# Patient Record
Sex: Female | Born: 1990 | Hispanic: Yes | Marital: Married | Smoking: Never smoker
Health system: Southern US, Community
[De-identification: ages and names within clinical notes are randomized; demographics above are authoritative.]

## PROBLEM LIST (undated history)

## (undated) ENCOUNTER — Inpatient Hospital Stay (HOSPITAL_COMMUNITY): Payer: Self-pay

## (undated) DIAGNOSIS — N200 Calculus of kidney: Secondary | ICD-10-CM

## (undated) DIAGNOSIS — N390 Urinary tract infection, site not specified: Secondary | ICD-10-CM

## (undated) DIAGNOSIS — N159 Renal tubulo-interstitial disease, unspecified: Secondary | ICD-10-CM

## (undated) DIAGNOSIS — N739 Female pelvic inflammatory disease, unspecified: Secondary | ICD-10-CM

---

## 2014-04-25 ENCOUNTER — Encounter (HOSPITAL_COMMUNITY): Payer: Self-pay | Admitting: Emergency Medicine

## 2014-04-25 ENCOUNTER — Emergency Department (HOSPITAL_COMMUNITY)
Admission: EM | Admit: 2014-04-25 | Discharge: 2014-04-26 | Disposition: A | Discharge status: Home / Self Care | Payer: Medicaid Other | Attending: Emergency Medicine | Admitting: Emergency Medicine

## 2014-04-25 DIAGNOSIS — N739 Female pelvic inflammatory disease, unspecified: Secondary | ICD-10-CM | POA: Insufficient documentation

## 2014-04-25 DIAGNOSIS — N390 Urinary tract infection, site not specified: Secondary | ICD-10-CM | POA: Insufficient documentation

## 2014-04-25 DIAGNOSIS — Z87442 Personal history of urinary calculi: Secondary | ICD-10-CM | POA: Diagnosis not present

## 2014-04-25 DIAGNOSIS — N730 Acute parametritis and pelvic cellulitis: Secondary | ICD-10-CM

## 2014-04-25 DIAGNOSIS — R300 Dysuria: Secondary | ICD-10-CM | POA: Diagnosis present

## 2014-04-25 DIAGNOSIS — R109 Unspecified abdominal pain: Secondary | ICD-10-CM

## 2014-04-25 HISTORY — DX: Calculus of kidney: N20.0

## 2014-04-25 HISTORY — DX: Urinary tract infection, site not specified: N39.0

## 2014-04-25 LAB — BASIC METABOLIC PANEL
ANION GAP: 12 (ref 5–15)
BUN: 12 mg/dL (ref 6–23)
CO2: 22 mEq/L (ref 19–32)
Calcium: 9.3 mg/dL (ref 8.4–10.5)
Chloride: 102 mEq/L (ref 96–112)
Creatinine, Ser: 0.59 mg/dL (ref 0.50–1.10)
GFR calc Af Amer: >90 mL/min (ref >90)
Glucose, Bld: 90 mg/dL (ref 70–99)
POTASSIUM: 3.9 meq/L (ref 3.7–5.3)
SODIUM: 136 meq/L — AB (ref 137–147)

## 2014-04-25 LAB — CBC
HCT: 36.9 % (ref 36.0–46.0)
Hemoglobin: 12.2 g/dL (ref 12.0–15.0)
MCH: 27.7 pg (ref 26.0–34.0)
MCHC: 33.1 g/dL (ref 30.0–36.0)
MCV: 83.7 fL (ref 78.0–100.0)
PLATELETS: 257 10*3/uL (ref 150–400)
RBC: 4.41 MIL/uL (ref 3.87–5.11)
RDW: 12.8 % (ref 11.5–15.5)
WBC: 12.2 10*3/uL — ABNORMAL HIGH (ref 4.0–10.5)

## 2014-04-25 LAB — URINE MICROSCOPIC-ADD ON

## 2014-04-25 LAB — URINALYSIS, ROUTINE W REFLEX MICROSCOPIC
Bilirubin Urine: NEGATIVE
Glucose, UA: NEGATIVE mg/dL
KETONES UR: NEGATIVE mg/dL
NITRITE: NEGATIVE
PROTEIN: 30 mg/dL — AB
Specific Gravity, Urine: 1.02 (ref 1.005–1.030)
UROBILINOGEN UA: 0.2 mg/dL (ref 0.0–1.0)
pH: 6.5 (ref 5.0–8.0)

## 2014-04-25 LAB — I-STAT BETA HCG BLOOD, ED (MC, WL, AP ONLY): I-stat hCG, quantitative: <5 m[IU]/mL (ref <5)

## 2014-04-25 MED ORDER — DEXTROSE 5 % IV SOLN
1.0000 g | Freq: Once | INTRAVENOUS | Status: AC
  Administered 2014-04-26: 1 g via INTRAVENOUS
  Filled 2014-04-25: qty 10

## 2014-04-25 MED ORDER — MORPHINE SULFATE 2 MG/ML IJ SOLN
2.0000 mg | Freq: Once | INTRAMUSCULAR | Status: AC
  Administered 2014-04-26: 2 mg via INTRAVENOUS
  Filled 2014-04-25: qty 1

## 2014-04-25 MED ORDER — SODIUM CHLORIDE 0.9 % IV BOLUS (SEPSIS)
1000.0000 mL | Freq: Once | INTRAVENOUS | Status: AC
  Administered 2014-04-25: 1000 mL via INTRAVENOUS

## 2014-04-25 NOTE — ED Provider Notes (Signed)
CSN: [id number]     Arrival date & time 04/25/14  2125 History   First MD Initiated Contact with Patient 04/25/14 2141     Chief Complaint  Patient presents with  . Dysuria     (Consider location/radiation/quality/duration/timing/severity/associated sxs/prior Treatment) The history is provided by the patient and medical records. No language interpreter was used.    Whitney MessierYanessa Pineiro Winters is a 23 y.o. female  with a hx of kidney stone and UTI presents to the Emergency Department complaining of gradual, persistent, progressively worsening dysuria with associated urgency and hematuria beginning at 3 PM today. Associated symptoms include left lower quadrant, right lower quadrant and left sided lower back pain.  Patient reports she was diagnosed with urinary tract infection 3 days ago and prescribed antibiotics however they were not filled until today; she does not remember the abx, but she has not taken any. Nothing makes it better and nothing makes it worse.  Pt reports this does not feel like her kidney stones.  Pt reports she is sexually active with 1 female partner (husband) and has no hx of STD.  Denies vaginal itching, pain or discharge.  Pt denies fever, chills, headache, chills, neck pain, chest pain, SOB, N/V/D, weakness, dizziness, syncope.     Past Medical History  Diagnosis Date  . Kidney stones   . UTI (urinary tract infection)    History reviewed. No pertinent past surgical history. No family history on file. History  Substance Use Topics  . Smoking status: Never Smoker   . Smokeless tobacco: Not on file  . Alcohol Use: No   OB History   Grav Para Term Preterm Abortions TAB SAB Ect Mult Living                 Review of Systems  Constitutional: Negative for fever, diaphoresis, appetite change, fatigue and unexpected weight change.  HENT: Negative for mouth sores.   Eyes: Negative for visual disturbance.  Respiratory: Negative for cough, chest tightness, shortness of  breath and wheezing.   Cardiovascular: Negative for chest pain.  Gastrointestinal: Positive for abdominal pain. Negative for nausea, vomiting, diarrhea and constipation.  Endocrine: Negative for polydipsia, polyphagia and polyuria.  Genitourinary: Positive for dysuria, urgency, frequency and hematuria.  Musculoskeletal: Negative for back pain and neck stiffness.  Skin: Negative for rash.  Allergic/Immunologic: Negative for immunocompromised state.  Neurological: Negative for syncope, light-headedness and headaches.  Hematological: Does not bruise/bleed easily.  Psychiatric/Behavioral: Negative for sleep disturbance. The patient is not nervous/anxious.       Allergies  Review of patient's allergies indicates no known allergies.  Home Medications   Prior to Admission medications   Medication Sig Start Date End Date Taking? Authorizing Provider  doxycycline (VIBRAMYCIN) 100 MG capsule Take 1 capsule (100 mg total) by mouth 2 (two) times daily. 04/26/14   Erastus Bartolomei, PA-C   BP 113/60  Pulse 93  Temp(Src) 98.1 F (36.7 C) (Oral)  Resp 14  Ht 5\' 2"  (1.575 m)  Wt 98 lb (44.453 kg)  BMI 17.92 kg/m2  SpO2 98%  LMP 03/26/2014 Physical Exam  Nursing note and vitals reviewed. Constitutional: She appears well-developed and well-nourished. No distress.  Awake, alert, nontoxic appearance  HENT:  Head: Normocephalic and atraumatic.  Mouth/Throat: Oropharynx is clear and moist. No oropharyngeal exudate.  Eyes: Conjunctivae are normal. No scleral icterus.  Neck: Normal range of motion. Neck supple.  Cardiovascular: Normal rate, regular rhythm, normal heart sounds and intact distal pulses.   No murmur heard.  Pulmonary/Chest: Effort normal and breath sounds normal. No respiratory distress. She has no wheezes.  Equal chest expansion  Abdominal: Soft. Normal appearance and bowel sounds are normal. She exhibits no distension and no mass. There is tenderness in the suprapubic area.  There is CVA tenderness (left). There is no rebound and no guarding. Hernia confirmed negative in the right inguinal area and confirmed negative in the left inguinal area.  Genitourinary: Uterus normal. No labial fusion. There is no rash, tenderness or lesion on the right labia. There is no rash, tenderness or lesion on the left labia. Uterus is not deviated, not enlarged, not fixed and not tender. Cervix exhibits motion tenderness (mild) and friability. Cervix exhibits no discharge. Right adnexum displays no mass, no tenderness and no fullness. Left adnexum displays no mass, no tenderness and no fullness. No erythema, tenderness or bleeding around the vagina. No foreign body around the vagina. No signs of injury around the vagina. Vaginal discharge (thick, yellow, small) found.  Musculoskeletal: Normal range of motion. She exhibits no edema.  Lymphadenopathy:       Right: No inguinal adenopathy present.       Left: No inguinal adenopathy present.  Neurological: She is alert. Coordination normal.  Speech is clear and goal oriented Moves extremities without ataxia  Skin: Skin is warm and dry. No rash noted. She is not diaphoretic. No erythema.  Psychiatric: She has a normal mood and affect.    ED Course  Procedures (including critical care time) Labs Review Labs Reviewed  WET PREP, GENITAL - Abnormal; Notable for the following:    WBC, Wet Prep HPF POC MANY (*)    All other components within normal limits  CBC - Abnormal; Notable for the following:    WBC 12.2 (*)    All other components within normal limits  BASIC METABOLIC PANEL - Abnormal; Notable for the following:    Sodium 136 (*)    All other components within normal limits  URINALYSIS, ROUTINE W REFLEX MICROSCOPIC - Abnormal; Notable for the following:    APPearance TURBID (*)    Hgb urine dipstick LARGE (*)    Protein, ur 30 (*)    Leukocytes, UA LARGE (*)    All other components within normal limits  URINE MICROSCOPIC-ADD ON  - Abnormal; Notable for the following:    Bacteria, UA MANY (*)    All other components within normal limits  URINE CULTURE  GC/CHLAMYDIA PROBE AMP  I-STAT BETA HCG BLOOD, ED ([facility], [facility], [facility] ONLY)    Imaging Review No results found.   EKG Interpretation None      MDM   Final diagnoses:  UTI (lower urinary tract infection)  Left flank pain  PID (acute pelvic inflammatory disease)   Whitney Winters presents with suprapubic abdominal tenderness, dysuria, hematuria.  Patient diagnosed with UTI 2 days ago and prescribed antibiotics which she has not filled or taken. Now with left CVA tenderness.  Patient without tachycardia, hypotension and is afebrile.    1:21 AM Wet prep with many WBCs, will treat for STD/PID and UTI as pt has urinary symptoms and mild CMT with vaginal discharge.  Pt given rocephin, azithromycin here in the ED and will be d/c home with doxycycline.    Patient to be discharged with instructions to follow up with OBGYN and or PCP.  Pt understands that they have GC/Chlamydia cultures pending. Pt has been treated prophylacticly with azithromycin and rocephin due to pts history, pelvic exam, and wet prep  with increased WBCs. Pt not concerning for TOA because no lateralizing adnexal tenderness, fevers, nausea or emesis.   I have personally reviewed patient's vitals, nursing note and any pertinent labs or imaging.  I performed an undressed physical exam.    It has been determined that no acute conditions requiring further emergency intervention are present at this time. The patient/guardian have been advised of the diagnosis and plan. I reviewed all labs and imaging including any potential incidental findings. We have discussed signs and symptoms that warrant return to the ED, such as fevers, intractable vomiting, worsening abdominal pain or other concerning symptoms.  Patient/guardian has voiced understanding and agreed to follow-up with the PCP or specialist in 3  days.  Vital signs are stable at discharge.   BP 113/60  Pulse 93  Temp(Src) 98.1 F (36.7 C) (Oral)  Resp 14  Ht $RemoveBeforeDEI _[id number]$5\' 2" SpO2 98%  LMP 03/26/2014        Dierdre ForthHannah Merrik [place], PA-C 04/26/14 [id number]

## 2014-04-25 NOTE — ED Notes (Addendum)
Pt reports difficulty urinating since 1500 today. Pt states when is able to urinate "it is only a little bit and it burns with a little bit of blood. Pt also c/o of LLQ, RLQ, and LT sided lower back pain. Pt denies fever/chills, n/v. Pt states she was dx with UTI x 3 days and prescribed antibiotics, but was unable to get them until today Pt states she has a hx of kidney stones and frequent UTI's. Warm, red rash on chest found in triage. No SOB. AO x4

## 2014-04-26 LAB — WET PREP, GENITAL
Clue Cells Wet Prep HPF POC: NONE SEEN
TRICH WET PREP: NONE SEEN
Yeast Wet Prep HPF POC: NONE SEEN

## 2014-04-26 MED ORDER — AZITHROMYCIN 250 MG PO TABS
1000.0000 mg | ORAL_TABLET | Freq: Once | ORAL | Status: AC
  Administered 2014-04-26: 1000 mg via ORAL
  Filled 2014-04-26: qty 4

## 2014-04-26 MED ORDER — DOXYCYCLINE HYCLATE 100 MG PO CAPS: 100.0000 mg | ORAL_CAPSULE | Freq: Two times a day (BID) | ORAL | Status: DC

## 2014-04-26 MED ORDER — AZITHROMYCIN 250 MG PO TABS: 1000.0000 mg | ORAL_TABLET | Freq: Once | ORAL | Status: DC

## 2014-04-26 MED ORDER — CEFTRIAXONE SODIUM 1 G IJ SOLR: 1.0000 g | Freq: Once | INTRAMUSCULAR | Status: DC

## 2014-04-26 NOTE — Discharge Instructions (Signed)
1. Medications: doxycycline, usual home medications 2. Treatment: rest, drink plenty of fluids,  3. Follow Up: Please followup with your primary doctor for discussion of your diagnoses and further evaluation after today's visit; if you do not have a primary care doctor use the resource guide provided to find one;     Abdominal Pain, Women Abdominal (stomach, pelvic, or belly) pain can be caused by many things. It is important to tell your doctor:  The location of the pain.  Does it come and go or is it present all the time?  Are there things that start the pain (eating certain foods, exercise)?  Are there other symptoms associated with the pain (fever, nausea, vomiting, diarrhea)? All of this is helpful to know when trying to find the cause of the pain. CAUSES   Stomach: virus or bacteria infection, or ulcer.  Intestine: appendicitis (inflamed appendix), regional ileitis (Crohn's disease), ulcerative colitis (inflamed colon), irritable bowel syndrome, diverticulitis (inflamed diverticulum of the colon), or cancer of the stomach or intestine.  Gallbladder disease or stones in the gallbladder.  Kidney disease, kidney stones, or infection.  Pancreas infection or cancer.  Fibromyalgia (pain disorder).  Diseases of the female organs:  Uterus: fibroid (non-cancerous) tumors or infection.  Fallopian tubes: infection or tubal pregnancy.  Ovary: cysts or tumors.  Pelvic adhesions (scar tissue).  Endometriosis (uterus lining tissue growing in the pelvis and on the pelvic organs).  Pelvic congestion syndrome (female organs filling up with blood just before the menstrual period).  Pain with the menstrual period.  Pain with ovulation (producing an egg).  Pain with an IUD (intrauterine device, birth control) in the uterus.  Cancer of the female organs.  Functional pain (pain not caused by a disease, may improve without treatment).  Psychological  pain.  Depression. DIAGNOSIS  Your doctor will decide the seriousness of your pain by doing an examination.  Blood tests.  X-rays.  Ultrasound.  CT scan (computed tomography, special type of X-ray).  MRI (magnetic resonance imaging).  Cultures, for infection.  Barium enema (dye inserted in the large intestine, to better view it with X-rays).  Colonoscopy (looking in intestine with a lighted tube).  Laparoscopy (minor surgery, looking in abdomen with a lighted tube).  Major abdominal exploratory surgery (looking in abdomen with a large incision). TREATMENT  The treatment will depend on the cause of the pain.   Many cases can be observed and treated at home.  Over-the-counter medicines recommended by your caregiver.  Prescription medicine.  Antibiotics, for infection.  Birth control pills, for painful periods or for ovulation pain.  Hormone treatment, for endometriosis.  Nerve blocking injections.  Physical therapy.  Antidepressants.  Counseling with a psychologist or psychiatrist.  Minor or major surgery. HOME CARE INSTRUCTIONS   Do not take laxatives, unless directed by your caregiver.  Take over-the-counter pain medicine only if ordered by your caregiver. Do not take aspirin because it can cause an upset stomach or bleeding.  Try a clear liquid diet (broth or water) as ordered by your caregiver. Slowly move to a bland diet, as tolerated, if the pain is related to the stomach or intestine.  Have a thermometer and take your temperature several times a day, and record it.  Bed rest and sleep, if it helps the pain.  Avoid sexual intercourse, if it causes pain.  Avoid stressful situations.  Keep your follow-up appointments and tests, as your caregiver orders.  If the pain does not go away with medicine or surgery,  you may try:  Acupuncture.  Relaxation exercises (yoga, meditation).  Group therapy.  Counseling. SEEK MEDICAL CARE IF:   You  notice certain foods cause stomach pain.  Your home care treatment is not helping your pain.  You need stronger pain medicine.  You want your IUD removed.  You feel faint or lightheaded.  You develop nausea and vomiting.  You develop a rash.  You are having side effects or an allergy to your medicine. SEEK IMMEDIATE MEDICAL CARE IF:   Your pain does not go away or gets worse.  You have a fever.  Your pain is felt only in portions of the abdomen. The right side could possibly be appendicitis. The left lower portion of the abdomen could be colitis or diverticulitis.  You are passing blood in your stools (bright red or black tarry stools, with or without vomiting).  You have blood in your urine.  You develop chills, with or without a fever.  You pass out. MAKE SURE YOU:   Understand these instructions.  Will watch your condition.  Will get help right away if you are not doing well or get worse. Document Released: 04/23/2007 Document Revised: 11/10/2013 Document Reviewed: 05/13/2009 [facility] Patient Information 2015 [place], [place]. This information is not intended to replace advice given to you by your health care provider. Make sure you discuss any questions you have with your health care provider.   Emergency Department Resource Guide 1) Find a Doctor and Pay Out of Pocket Although you won't have to find out who is covered by your insurance plan, it is a good idea to ask around and get recommendations. You will then need to call the office and see if the doctor you have chosen will accept you as a new patient and what types of options they offer for patients who are self-pay. Some doctors offer discounts or will set up payment plans for their patients who do not have insurance, but you will need to ask so you aren't surprised when you get to your appointment.  2) Contact Your Local Health Department Not all health departments have doctors that can see patients for sick  visits, but many do, so it is worth a call to see if yours does. If you don't know where your local health department is, you can check in your phone book. The CDC also has a tool to help you locate your state's health department, and many state websites also have listings of all of their local health departments.  3) Find a Walk-in Clinic If your illness is not likely to be very severe or complicated, you may want to try a walk in clinic. These are popping up all over the country in pharmacies, drugstores, and shopping centers. They're usually staffed by nurse practitioners or physician assistants that have been trained to treat common illnesses and complaints. They're usually fairly quick and inexpensive. However, if you have serious medical issues or chronic medical problems, these are probably not your best option.  No Primary Care Doctor: - Call Health Connect at  [phone] - they can help you locate a primary care doctor that  accepts your insurance, provides certain services, etc. - Physician Referral Service- [phone]  Chronic Pain Problems: Organization         Address  Phone   Notes  [facility]  [phone] Patients need to be referred by their primary care doctor.   Medication Assistance: Organization  Address  Phone   Notes  [facility] Medication [facility] [address] [place], [place] [address] [phone] --Must be a resident of [facility] -- Must have NO insurance coverage whatsoever (no Medicaid/ Medicare, etc.) -- The pt. MUST have a primary care doctor that directs their care regularly and follows them in the community   Med[facility]ist  [phone]   [employer]  [phone]    Agencies that provide inexpensive medical care: Organization         Address  Phone   Notes  [place]  [phone]   [facility]    [phone]   [facility] [place], [place] [address] [phone]   [place] [address] [place] [phone]   Planned Parenthood    [phone]   [facility]    [phone]   [place] and [address], [place] Phone:  [phone], Fax:  [phone] Hours of Operation:  9 am - 6 pm, M-F.  Also accepts Medicaid/Medicare and self-pay.  [facility] for [place], [address], [place] Phone: [phone], Fax: [phone]. Hours of Operation:  8:30 am - 5:30 pm, M-F.  Also accepts Medicaid and self-pay.  [facility] [address], [place] Phone: [phone]   [place], [place], [place] [phone], Ext. [phone] Mondays & Thursdays: 7-9 AM.  First 15 patients are seen on a first come, first serve basis.    [place] Providers:  Organization         Address  Phone   Notes  [facility] [address], [place] [phone] Also accepts self-pay patients.  [facility] [address], [place]  [phone]   [place], [address], [place] [phone]   [facility] [address], [place] [phone]   Lucianne Lei [address], [place]   [phone] Only accepts [place] Access [place] patients after they have their name applied to their card.   Self-Pay (no insurance) in [facility]:  Organization         Address  Phone   Notes  Sickle Cell Patients, [facility] Medicine [place] [phone]   [facility] Urgent Care [place] [phone]   [facility] [place]  [place], [place], [place] [phone]   [facility]/Dr. Osei-Bonsu  [address]  [place] or [address], [place] [phone] Phone number for both [place] and [place] locations is the same.  Urgent Medical and [facility] [address] [place] [phone]   [facility] [address] [place] or [address] [phone]   [facility] [address] [place] [phone], phone; [phone], fax Sees patients 1st and 3rd Saturday of every month.  Must not qualify for public or private insurance (i.e. Medicaid, Medicare, [place] Health Choice, Veterans' Benefits)  Household income should be no more than 200% of the poverty level The clinic cannot treat you if you are pregnant or think you  are pregnant  Sexually transmitted diseases are not treated at the clinic.    Dental Care: Organization         Address  Phone  Notes  [facility] Department of [facility] [place] [phone] Accepts children up to age 60 who are enrolled in [place] or [place]; pregnant women with a Medicaid card; and children who have applied for Medicaid or [place] Health Choice, but were declined, whose parents can pay a reduced fee at time of service.  [facility] Department of [facility]  [address], [place] [phone] Accepts children up to age 43 who are enrolled in [place] or [place]; pregnant women with a Medicaid card; and children who have applied for Medicaid or [place] Health Choice, but were declined, whose parents can pay a reduced fee at time of service.  [place] Adult Dental Access PROGRAM  [place] [phone] Patients are seen by appointment only. Walk-ins are not accepted. [place] will see patients 47 years of age and older. Monday - Tuesday (8am-5pm) Most Wednesdays (8:30-5pm) $30 per visit, cash only  [facility] Adult Dental Access PROGRAM  [address]  Dr, [facility] [phone] Patients are seen by appointment only. Walk-ins are not accepted. [place] will see patients 27 years of age and older. One Wednesday Evening (Monthly: Volunteer Based).  $30 per visit, cash only  [place]  [phone] for adults; Children under age 24, call Graduate Pediatric Dentistry at [phone]. Children aged 14-14, please call [phone] to request a pediatric application.  Dental services are provided in all areas of dental care including fillings, crowns and bridges, complete and partial dentures, implants, gum treatment, root canals, and extractions. Preventive care is also provided. Treatment is provided to both adults and children. Patients are selected via a lottery and there is often a waiting list.   [facility] [address], [place]  [phone] [email or web address]   [facility] [address] [place], [place] [phone], Ext. [phone] Second and Fourth Thursday of each month, opens at 6:30 AM; Clinic ends at 9 AM.  Patients are seen on a first-come first-served basis, and a limited number are seen during each clinic.   [facility]  [address] Hillard Danker [place], [place] [phone]   Eligibility Requirements You must have lived in [place], [place], or [place] counties for at least the last three months.   You cannot be eligible for state or federal sponsored [employer], including [employer], [place], or [employer].   You generally cannot be eligible for healthcare insurance through your employer.    How to apply: Eligibility screenings are held every Tuesday and Wednesday afternoon from 1:00 pm until 4:00 pm. You do not need an appointment for the interview!  [facility] [address] [place], [place]   [place]  [place] Department  [place]  [phone]    Behavioral Health Resources in the Community: Intensive Outpatient Programs Organization         Address  Phone  Notes  [place]. [address] [place], [place] [phone]   [facility] [address] [place], [place]   ADS: Alcohol & Drug Svcs [address]  Dr, [place], [place]   [place] [address],  [place], [place] or [phone]   Substance Abuse Resources Organization         Address  Phone  Notes  Alcohol and Drug Services  [phone]   [place]  [phone]   [facility]   Chinita Pester  [phone]   Residential & Outpatient Substance Abuse Program  [phone]   Psychological Services Organization         Address  Phone  Notes  [facility]  [place]  [phone]   [place] [address], [place] or [phone]    Mobile Crisis Teams Organization         Address  Phone  Notes  [facility], Mobile Crisis Care Unit  [phone]   [facility]  [address] [place], [place]   [place] [address], [place] [phone]    Self-Help/Support Groups Organization         Address  Phone             Notes  [place]. of [place] - variety of support groups  [place] Call for more information  Narcotics Anonymous (NA), Caring [facility] [address], [employer] [place]  2 meetings at this location   Special educational needs teacher         Address  Phone  Notes  [facility]idential [facility] [place],    [place]  [phone]   [facility]  [address] [place] [address], [place], [place]   Ewing Worden, [place]  [phone] Admissions: 8am-3pm M-F  Incentives Substance [place] [address],    [place], [place] [phone]   [facility] [address] [place], [place], [place]   [facility] [address]  [place], [place]   [facility] Programs - Intensive Outpatient [address] Krist[address], [place], [place]   [facility] ([place] [address].,  [place], [place] [phone] or [phone]   [facility] (RTS) [address], [place], [place] Accepts Medicaid  [facility] [place] [address]  [place] [phone] Substance Abuse/Addiction Treatment   [facility] Organization         Address  Phone  Notes  [facility]  [phone]   Domenic Schwab, PhD [address] Arlis Porta [place], [place]   [phone] or [phone]   [place], [place] [phone]   [facility] [address] [place], [place] [phone] Insurance/Medicaid/sponsorship through [facility] and Families [address]                                    [place], [place] [phone] [place] [address] [place], [place] [phone]    Dr. Adele Schilder  [phone]   [facility] Dept. 1) [address] [place] 2) [place] 3)  [place] [address], [place] [phone]  [phone]   [place] [phone])  [phone] or [phone] (After Hours)

## 2014-04-26 NOTE — ED Provider Notes (Signed)
Medical screening examination/treatment/procedure(s) were performed by non-physician practitioner and as supervising physician I was immediately available for consultation/collaboration.  Flint MelterElliott L Dyanna Seiter, MD 04/26/14 [phone]

## 2014-04-27 LAB — GC/CHLAMYDIA PROBE AMP
CT PROBE, AMP APTIMA: NEGATIVE
GC Probe RNA: NEGATIVE

## 2014-04-28 LAB — URINE CULTURE: Colony Count: >=100000

## 2014-04-29 ENCOUNTER — Telehealth (HOSPITAL_COMMUNITY): Payer: Self-pay

## 2014-04-29 NOTE — Progress Notes (Signed)
ED Antimicrobial Stewardship Positive Culture Follow Up   Whitney Winters is an 23 y.o. female [facility] with a chief complaint of  Chief Complaint  Patient presents with  . Dysuria    Recent Results (from the past 720 hour(s))  URINE CULTURE     Status: None   Collection Time    04/25/14 11:06 PM      Result Value Ref Range Status   Specimen Description URINE, CLEAN CATCH   Final   Special Requests NONE   Final   Culture  Setup Time     Final   Value: 04/26/2014 15:44     [facility]formed at [facility]   Colony Count     Final   Value: >=100,000 COLONIES/ML     [facility]formed at [facility]   Culture     Final   Value: STAPHYLOCOCCUS SPECIES (COAGULASE NEGATIVE)     Note: RIFAMPIN AND GENTAMICIN SHOULD NOT BE USED AS SINGLE DRUGS FOR TREATMENT OF STAPH INFECTIONS.     Performed at [facility]   Report Status 04/28/2014 FINAL   Final   Organism ID, Bacteria STAPHYLOCOCCUS SPECIES (COAGULASE NEGATIVE)   Final  WET PREP, GENITAL     Status: Abnormal   Collection Time    04/26/14 12:38 AM      Result Value Ref Range Status   Yeast Wet Prep HPF POC NONE SEEN  NONE SEEN Final   Trich, Wet Prep NONE SEEN  NONE SEEN Final   Clue Cells Wet Prep HPF POC NONE SEEN  NONE SEEN Final   WBC, Wet Prep HPF POC MANY (*) NONE SEEN Final  GC/CHLAMYDIA PROBE AMP     Status: None   Collection Time    04/26/14 12:38 AM      Result Value Ref Range Status   CT Probe RNA NEGATIVE  NEGATIVE Final   GC Probe RNA NEGATIVE  NEGATIVE Final   Comment: (NOTE)                                                                                               **Normal Reference Range: Negative**          Assay performed using the Gen-Probe APTIMA COMBO2 (R) Assay.     Acceptable specimen types for this assay include APTIMA Swabs (Unisex,     endocervical, urethral, or vaginal), first void urine, and ThinPrep     liquid based cytology samples.   Performed at [facility]    Doxycycline prescribed for STD/PID coverage (patient also received Azithromycin 1g and Ceftriaxone 1g in ED) - not adequate coverage for Coagulase-negative staphylococcus UTI.  Recommendations: 1. Complete Doxycycline 2. Start Macrobid 100mg  PO BID x 7 days  ED Provider: Mellody DrownLauren Parker, PA-C   Cleon Dewulaney, [place] Robert 04/29/2014, 10:20 AM Infectious Diseases Pharmacist Phone# [phone]

## 2014-04-29 NOTE — ED Notes (Signed)
Post ED Visit - Positive Culture Follow-up: Successful Patient Follow-Up  Culture assessed and recommendations reviewed by: [x]  Wes Dulaney, Pharm.D., BCPS []  Celedonio MiyamotoJeremy Frens, Pharm.D., BCPS []  Georgina PillionElizabeth Martin, Pharm.D., BCPS []  BoydenMinh Pham, [address] BoulevardPharm.D., BCPS, AAHIVP []  Estella HuskMichelle Turner, Pharm.D., BCPS, AAHIVP []  [place] ChristiansSamson Lee, Pharm.D. []  Cassie BethuneStewart, [place]Pharm.D.  Positive  Urine culture  []  Patient discharged without antimicrobial prescription and treatment is now indicated [x]  Organism is resistant to prescribed ED discharge antimicrobial []  Patient with positive blood cultures  Changes discussed with ED provider: Mellody DrownLauren parker PA New antibiotic prescription continue doxy but add Macrobid 100mg  po bid x 7 days [facility] to [facility]  Contacted patient, date 10/21, time 1225   Ashley JacobsFesterman, Angeliyah Kirkey C 04/29/2014, 12:23 PM

## 2014-04-30 ENCOUNTER — Telehealth (HOSPITAL_BASED_OUTPATIENT_CLINIC_OR_DEPARTMENT_OTHER): Payer: Self-pay | Admitting: Emergency Medicine

## 2014-04-30 NOTE — Telephone Encounter (Signed)
Post ED Visit - Positive Culture Follow-up: Successful Patient Follow-Up  Culture assessed and recommendations reviewed by: [x]  Wes Dulaney, Pharm.D., BCPS []  Celedonio MiyamotoJeremy Frens, Pharm.D., BCPS []  Georgina PillionElizabeth Martin, [address] BoulevardPharm.D., BCPS []  HalseyMinh Pham, [place]Pharm.D., BCPS, AAHIVP []  Estella HuskMichelle Turner, Pharm.D., BCPS, AAHIVP []  [place] ChristiansSamson Lee, Pharm.D. []  Tennis Mustassie Stewart, [place]arm.D.  Positive urine culture   []  Patient discharged without antimicrobial prescription and treatment is now indicated [x]  Organism is resistant to prescribed ED discharge antimicrobial []  Patient with positive blood cultures  Changes discussed with ED provider:Lauren Jimmey RalphParker PA New antibiotic prescription Macrobid 100mg  po bid x 7 days, continue Doxycycline  [facility] [place] [phone]  Contacted patient, date 04/30/14, time 1156   Berle MullMiller, Duaa Stelzner 04/30/2014, 12:06 PM

## 2014-08-20 ENCOUNTER — Inpatient Hospital Stay (HOSPITAL_COMMUNITY)
Admission: AD | Admit: 2014-08-20 | Discharge: 2014-08-23 | DRG: 781 | Disposition: A | Discharge status: Home / Self Care | Payer: Medicaid Other | Source: Ambulatory Visit | Attending: Obstetrics & Gynecology | Admitting: Obstetrics & Gynecology

## 2014-08-20 ENCOUNTER — Encounter (HOSPITAL_COMMUNITY): Payer: Self-pay | Admitting: Emergency Medicine

## 2014-08-20 DIAGNOSIS — N730 Acute parametritis and pelvic cellulitis: Secondary | ICD-10-CM

## 2014-08-20 DIAGNOSIS — N739 Female pelvic inflammatory disease, unspecified: Secondary | ICD-10-CM | POA: Diagnosis present

## 2014-08-20 DIAGNOSIS — O23511 Infections of cervix in pregnancy, first trimester: Secondary | ICD-10-CM | POA: Diagnosis present

## 2014-08-20 DIAGNOSIS — O9989 Other specified diseases and conditions complicating pregnancy, childbirth and the puerperium: Principal | ICD-10-CM | POA: Diagnosis present

## 2014-08-20 DIAGNOSIS — Z3A13 13 weeks gestation of pregnancy: Secondary | ICD-10-CM | POA: Diagnosis present

## 2014-08-20 DIAGNOSIS — Z3490 Encounter for supervision of normal pregnancy, unspecified, unspecified trimester: Secondary | ICD-10-CM

## 2014-08-20 DIAGNOSIS — R109 Unspecified abdominal pain: Secondary | ICD-10-CM

## 2014-08-20 HISTORY — DX: Calculus of kidney: N20.0

## 2014-08-20 LAB — CBC WITH DIFFERENTIAL/PLATELET
BASOS ABS: 0 10*3/uL (ref 0.0–0.1)
BASOS PCT: 0 % (ref 0–1)
Eosinophils Absolute: 0.1 10*3/uL (ref 0.0–0.7)
Eosinophils Relative: 1 % (ref 0–5)
HEMATOCRIT: 36.4 % (ref 36.0–46.0)
Hemoglobin: 12.1 g/dL (ref 12.0–15.0)
Lymphocytes Relative: 26 % (ref 12–46)
Lymphs Abs: 2.4 10*3/uL (ref 0.7–4.0)
MCH: 28.3 pg (ref 26.0–34.0)
MCHC: 33.2 g/dL (ref 30.0–36.0)
MCV: 85.2 fL (ref 78.0–100.0)
Monocytes Absolute: 0.6 10*3/uL (ref 0.1–1.0)
Monocytes Relative: 7 % (ref 3–12)
NEUTROS ABS: 6.1 10*3/uL (ref 1.7–7.7)
NEUTROS PCT: 66 % (ref 43–77)
PLATELETS: 297 10*3/uL (ref 150–400)
RBC: 4.27 MIL/uL (ref 3.87–5.11)
RDW: 13.9 % (ref 11.5–15.5)
WBC: 9.2 10*3/uL (ref 4.0–10.5)

## 2014-08-20 LAB — COMPREHENSIVE METABOLIC PANEL
ALT: 19 U/L (ref 0–35)
ANION GAP: 10 (ref 5–15)
AST: 22 U/L (ref 0–37)
Albumin: 3.7 g/dL (ref 3.5–5.2)
Alkaline Phosphatase: 85 U/L (ref 39–117)
BUN: 15 mg/dL (ref 6–23)
CHLORIDE: 106 mmol/L (ref 96–112)
CO2: 20 mmol/L (ref 19–32)
Calcium: 9.4 mg/dL (ref 8.4–10.5)
Creatinine, Ser: 0.44 mg/dL — ABNORMAL LOW (ref 0.50–1.10)
GFR calc Af Amer: >90 mL/min (ref >90)
GLUCOSE: 88 mg/dL (ref 70–99)
POTASSIUM: 4 mmol/L (ref 3.5–5.1)
Sodium: 136 mmol/L (ref 135–145)
Total Bilirubin: 0.3 mg/dL (ref 0.3–1.2)
Total Protein: 6.7 g/dL (ref 6.0–8.3)

## 2014-08-20 NOTE — ED Notes (Signed)
Pt. reports low abdominal pain with multiple emesis and dysuria onset 3 days ago , LMP 05/31/2014 ( G2P1 ) unsure of AOG , no fever or chills.

## 2014-08-20 NOTE — ED Provider Notes (Signed)
CSN: [id number]     Arrival date & time 08/20/14  2259 History  This chart was scribed for Lamoine Fredricksen Smitty Cords, MD by Murriel Hopper, ED Scribe. This patient was seen in room D34C/D34C and the patient's care was started at 12:08 AM.    Chief Complaint  Patient presents with  . Abdominal Pain      Patient is a 24 y.o. female presenting with abdominal pain. The history is provided by the patient. No language interpreter was used.  Abdominal Pain Pain location:  Suprapubic Pain radiates to:  Does not radiate Pain severity:  Severe Onset quality:  Gradual Timing:  Constant Progression:  Worsening Chronicity:  New Context: not alcohol use   Relieved by:  Nothing Worsened by:  Nothing tried Associated symptoms: dysuria, vaginal bleeding, vaginal discharge and vomiting   Associated symptoms: no diarrhea   Risk factors: pregnancy      HPI Comments: Whitney Winters is a 24 y.o. female who is pregnant who presents to the Emergency Department complaining of constant suprapubic pain with associated vaginal discharge that has been present since two weeks ago. Pt also states that she has vomited several times and had dysuria for the past three days. Pt states that her LMP was 05/31/2014, and is unsure of how far along her pregnancy is. Pt states that she was confirmed to be pregnant through a urine test about a month ago, and has not had an ultrasound. Pt also notes that she experienced vaginal bleeding upon arrival to ED tonight with wiping. Pt states that this is her second pregnancy, as she has a child at home. Pt states she is seeing an obstetrician for her first appointment tomorrow in [place]. Pt denies diarrhea.     Past Medical History  Diagnosis Date  . Kidney stones   . UTI (urinary tract infection)   . Renal stones    History reviewed. No pertinent past surgical history. No family history on file. History  Substance Use Topics  . Smoking status: Never Smoker   .  Smokeless tobacco: Not on file  . Alcohol Use: No   OB History    Gravida Para Term Preterm AB TAB SAB Ectopic Multiple Living   1              Review of Systems  Gastrointestinal: Positive for vomiting and abdominal pain. Negative for diarrhea.  Genitourinary: Positive for dysuria, vaginal bleeding and vaginal discharge.  All other systems reviewed and are negative.     Allergies  Review of patient's allergies indicates no known allergies.  Home Medications   Prior to Admission medications   Medication Sig Start Date End Date Taking? Authorizing Provider  doxycycline (VIBRAMYCIN) 100 MG capsule Take 1 capsule (100 mg total) by mouth 2 (two) times daily. 04/26/14   Hannah Muthersbaugh, PA-C   BP 141/74 mmHg  Pulse 111  Temp(Src) 98.2 F (36.8 C) (Oral)  Resp 18  Ht  (1.549 m)  Wt 100 lb (45.36 kg)  BMI 18.90 kg/m2  SpO2 99%  LMP 05/31/2014 Physical Exam  Constitutional: She is oriented to person, place, and time. She appears well-developed and well-nourished.  HENT:  Head: Normocephalic and atraumatic.  Mouth/Throat: Oropharynx is clear and moist.  Eyes: Conjunctivae are normal. Pupils are equal, round, and reactive to light.  Neck: Normal range of motion. Neck supple.  Cardiovascular: Normal rate and regular rhythm.   Pulmonary/Chest: Effort normal. She has no wheezes. She has no rales.  Abdominal:  Soft. She exhibits no distension and no mass. There is tenderness. There is no rebound and no guarding.  Suprapubic   Genitourinary: Cervix exhibits motion tenderness and discharge. Right adnexum displays tenderness. Left adnexum displays tenderness. Vaginal discharge found.  Chaperone present green discharge  Musculoskeletal: Normal range of motion.  Neurological: She is alert and oriented to person, place, and time.  Skin: Skin is warm and dry.  Psychiatric: She has a normal mood and affect.  Nursing note and vitals reviewed.   ED Course  Procedures  (including critical care time)  DIAGNOSTIC STUDIES: Oxygen Saturation is 99% on RA, normal by my interpretation.    COORDINATION OF CARE: 12:12 AM Discussed treatment plan with pt at bedside and pt agreed to plan.     Labs Review Labs Reviewed  CBC WITH DIFFERENTIAL/PLATELET  COMPREHENSIVE METABOLIC PANEL  URINALYSIS, ROUTINE W REFLEX MICROSCOPIC  POC URINE PREG, ED    Imaging Review No results found.   EKG Interpretation None      MDM   Final diagnoses:  None    Medications  cefTRIAXone (ROCEPHIN) 1 g in dextrose 5 % 50 mL IVPB (not administered)  azithromycin (ZITHROMAX) 500 mg in dextrose 5 % 250 mL IVPB (not administered)  azithromycin (ZITHROMAX) 500 mg in dextrose 5 % 250 mL IVPB (not administered)  sodium chloride 0.9 % bolus 1,000 mL (not administered)   Results for orders placed or performed during the hospital encounter of 08/20/14  Wet prep, genital  Result Value Ref Range   Yeast Wet Prep HPF POC NONE SEEN NONE SEEN   Trich, Wet Prep NONE SEEN NONE SEEN   Clue Cells Wet Prep HPF POC FEW (A) NONE SEEN   WBC, Wet Prep HPF POC TOO NUMEROUS TO COUNT (A) NONE SEEN  CBC with Differential  Result Value Ref Range   WBC 9.2 4.0 - 10.5 K/uL   RBC 4.27 3.87 - 5.11 MIL/uL   Hemoglobin 12.1 12.0 - 15.0 g/dL   HCT 16.1 09.6 - 04.5 %   MCV 85.2 78.0 - 100.0 fL   MCH 28.3 26.0 - 34.0 pg   MCHC 33.2 30.0 - 36.0 g/dL   RDW 40.9 81.1 - 91.4 %   Platelets 297 150 - 400 K/uL   Neutrophils Relative % 66 43 - 77 %   Neutro Abs 6.1 1.7 - 7.7 K/uL   Lymphocytes Relative 26 12 - 46 %   Lymphs Abs 2.4 0.7 - 4.0 K/uL   Monocytes Relative 7 3 - 12 %   Monocytes Absolute 0.6 0.1 - 1.0 K/uL   Eosinophils Relative 1 0 - 5 %   Eosinophils Absolute 0.1 0.0 - 0.7 K/uL   Basophils Relative 0 0 - 1 %   Basophils Absolute 0.0 0.0 - 0.1 K/uL  Comprehensive metabolic panel  Result Value Ref Range   Sodium 136 135 - 145 mmol/L   Potassium 4.0 3.5 - 5.1 mmol/L   Chloride 106  96 - 112 mmol/L   CO2 20 19 - 32 mmol/L   Glucose, Bld 88 70 - 99 mg/dL   BUN 15 6 - 23 mg/dL   Creatinine, Ser 7.82 (L) 0.50 - 1.10 mg/dL   Calcium 9.4 8.4 - 95.6 mg/dL   Total Protein 6.7 6.0 - 8.3 g/dL   Albumin 3.7 3.5 - 5.2 g/dL   AST 22 0 - 37 U/L   ALT 19 0 - 35 U/L   Alkaline Phosphatase 85 39 - 117 U/L   Total  Bilirubin 0.3 0.3 - 1.2 mg/dL   GFR calc non Af Amer >90 >90 mL/min   GFR calc Af Amer >90 >90 mL/min   Anion gap 10 5 - 15  Urinalysis, Routine w reflex microscopic  Result Value Ref Range   Color, Urine YELLOW YELLOW   APPearance TURBID (A) CLEAR   Specific Gravity, Urine 1.021 1.005 - 1.030   pH 7.0 5.0 - 8.0   Glucose, UA NEGATIVE NEGATIVE mg/dL   Hgb urine dipstick NEGATIVE NEGATIVE   Bilirubin Urine NEGATIVE NEGATIVE   Ketones, ur NEGATIVE NEGATIVE mg/dL   Protein, ur NEGATIVE NEGATIVE mg/dL   Urobilinogen, UA 1.0 0.0 - 1.0 mg/dL   Nitrite NEGATIVE NEGATIVE   Leukocytes, UA LARGE (A) NEGATIVE  hCG, quantitative, pregnancy  Result Value Ref Range   hCG, Beta Chain, Quant, S 161096 (H) <5 mIU/mL  Urine microscopic-add on  Result Value Ref Range   Squamous Epithelial / LPF FEW (A) RARE   WBC, UA 7-10 <3 WBC/hpf   RBC / HPF 0-2 <3 RBC/hpf   Bacteria, UA RARE RARE   Urine-Other AMORPHOUS URATES/PHOSPHATES   POC Urine Pregnancy, ED (do NOT order at [facility])  Result Value Ref Range   Preg Test, Ur POSITIVE (A) NEGATIVE   US Ob Comp Less 14 Wks  08/21/2014   CLINICAL DATA:  Acute onset of generalized abdominal pain. Initial encounter.  EXAM: OBSTETRIC <14 WK [place] AND TRANSVAGINAL OB US  TECHNIQUE: Both transabdominal and transvaginal ultrasound examinations were performed for complete evaluation of the gestation as well as the maternal uterus, adnexal regions, and pelvic cul-de-sac. Transvaginal technique was performed to assess early pregnancy.  COMPARISON:  None.  FINDINGS: Intrauterine gestational sac: Visualized/normal in shape.  Yolk sac:  Yes  Embryo:  Yes   Cardiac Activity: Yes  Heart Rate: 157  bpm  CRL:  6.5 cm   12 w   6 d                  [place] EDC: 02/27/2015  Maternal uterus/adnexae: No subchorionic hemorrhage is noted. Uterus otherwise unremarkable in appearance.  The ovaries are within normal limits. The right ovary measures 3.4 x 1.9 x 2.0 cm, while the left ovary measures 2.8 x 2.0 x 2.0 cm. No suspicious adnexal masses are seen; there is no evidence for ovarian torsion  No free fluid is seen within the pelvic cul-de-sac.  IMPRESSION: Single live intrauterine pregnancy noted, with a crown-rump length of 6.5 cm, corresponding to a gestational age of [redacted] weeks 6 days. This does not match the gestational age by LMP, and reflects a new estimated date of delivery of February 27, 2015.   Electronically Signed   By: Roanna Raider M.D.   On: 08/21/2014 02:05   US Ob Transvaginal  08/21/2014   CLINICAL DATA:  Acute onset of generalized abdominal pain. Initial encounter.  EXAM: OBSTETRIC <14 WK [place] AND TRANSVAGINAL OB US  TECHNIQUE: Both transabdominal and transvaginal ultrasound examinations were performed for complete evaluation of the gestation as well as the maternal uterus, adnexal regions, and pelvic cul-de-sac. Transvaginal technique was performed to assess early pregnancy.  COMPARISON:  None.  FINDINGS: Intrauterine gestational sac: Visualized/normal in shape.  Yolk sac:  Yes  Embryo:  Yes  Cardiac Activity: Yes  Heart Rate: 157  bpm  CRL:  6.5 cm   12 w   6 d                  [place]  EDC: 02/27/2015  Maternal uterus/adnexae: No subchorionic hemorrhage is noted. Uterus otherwise unremarkable in appearance.  The ovaries are within normal limits. The right ovary measures 3.4 x 1.9 x 2.0 cm, while the left ovary measures 2.8 x 2.0 x 2.0 cm. No suspicious adnexal masses are seen; there is no evidence for ovarian torsion  No free fluid is seen within the pelvic cul-de-sac.  IMPRESSION: Single live intrauterine pregnancy noted, with a crown-rump length of 6.5 cm,  corresponding to a gestational age of [redacted] weeks 6 days. This does not match the gestational age by LMP, and reflects a new estimated date of delivery of February 27, 2015.   Electronically Signed   By: Roanna RaiderJeffery  Chang M.D.   On: 08/21/2014 02:05     Case d/w Dr. Vicente SereneAyanwu, rocephin and azithro and transfer to [facility]  I personally performed the services described in this documentation, which was scribed in my presence. The recorded information has been reviewed and is accurate.     Jasmine AweApril K Kamori Kitchens-Rasch, MD 08/21/14 [phone]

## 2014-08-21 ENCOUNTER — Emergency Department (HOSPITAL_COMMUNITY): Payer: Medicaid Other

## 2014-08-21 ENCOUNTER — Encounter (HOSPITAL_COMMUNITY): Payer: Self-pay | Admitting: Emergency Medicine

## 2014-08-21 DIAGNOSIS — N739 Female pelvic inflammatory disease, unspecified: Secondary | ICD-10-CM

## 2014-08-21 DIAGNOSIS — R109 Unspecified abdominal pain: Secondary | ICD-10-CM | POA: Diagnosis present

## 2014-08-21 DIAGNOSIS — Z3A13 13 weeks gestation of pregnancy: Secondary | ICD-10-CM | POA: Diagnosis present

## 2014-08-21 DIAGNOSIS — O23511 Infections of cervix in pregnancy, first trimester: Secondary | ICD-10-CM | POA: Diagnosis present

## 2014-08-21 DIAGNOSIS — O9989 Other specified diseases and conditions complicating pregnancy, childbirth and the puerperium: Secondary | ICD-10-CM | POA: Diagnosis present

## 2014-08-21 LAB — URINE MICROSCOPIC-ADD ON

## 2014-08-21 LAB — GC/CHLAMYDIA PROBE AMP (~~LOC~~) NOT AT ARMC
Chlamydia: NEGATIVE
Neisseria Gonorrhea: NEGATIVE

## 2014-08-21 LAB — URINALYSIS, ROUTINE W REFLEX MICROSCOPIC
Bilirubin Urine: NEGATIVE
Glucose, UA: NEGATIVE mg/dL
Hgb urine dipstick: NEGATIVE
Ketones, ur: NEGATIVE mg/dL
NITRITE: NEGATIVE
Protein, ur: NEGATIVE mg/dL
SPECIFIC GRAVITY, URINE: 1.021 (ref 1.005–1.030)
UROBILINOGEN UA: 1 mg/dL (ref 0.0–1.0)
pH: 7 (ref 5.0–8.0)

## 2014-08-21 LAB — WET PREP, GENITAL
Trich, Wet Prep: NONE SEEN
Yeast Wet Prep HPF POC: NONE SEEN

## 2014-08-21 LAB — HCG, QUANTITATIVE, PREGNANCY: hCG, Beta Chain, Quant, S: 102838 m[IU]/mL — ABNORMAL HIGH (ref <5)

## 2014-08-21 LAB — POC URINE PREG, ED: Preg Test, Ur: POSITIVE — AB

## 2014-08-21 MED ORDER — DOCUSATE SODIUM 100 MG PO CAPS: 100.0000 mg | ORAL_CAPSULE | Freq: Two times a day (BID) | ORAL | Status: DC | PRN

## 2014-08-21 MED ORDER — HYDROMORPHONE HCL 1 MG/ML IJ SOLN: 0.2000 mg | INTRAMUSCULAR | Status: DC | PRN

## 2014-08-21 MED ORDER — PRENATAL MULTIVITAMIN CH
1.0000 | ORAL_TABLET | Freq: Every day | ORAL | Status: DC
  Administered 2014-08-21: 1 via ORAL
  Filled 2014-08-21: qty 1

## 2014-08-21 MED ORDER — DEXTROSE 5 % IV SOLN
500.0000 mg | Freq: Once | INTRAVENOUS | Status: AC
  Administered 2014-08-21: 500 mg via INTRAVENOUS
  Filled 2014-08-21: qty 500

## 2014-08-21 MED ORDER — DEXTROSE 5 % IV SOLN
500.0000 mg | INTRAVENOUS | Status: DC
  Administered 2014-08-21 – 2014-08-22 (×2): 500 mg via INTRAVENOUS
  Filled 2014-08-21 (×3): qty 500

## 2014-08-21 MED ORDER — METRONIDAZOLE 500 MG PO TABS
500.0000 mg | ORAL_TABLET | Freq: Two times a day (BID) | ORAL | Status: DC
  Administered 2014-08-21 – 2014-08-22 (×3): 500 mg via ORAL
  Filled 2014-08-21 (×5): qty 1

## 2014-08-21 MED ORDER — OXYCODONE-ACETAMINOPHEN 5-325 MG PO TABS
1.0000 | ORAL_TABLET | ORAL | Status: DC | PRN
  Administered 2014-08-21 (×2): 1 via ORAL
  Administered 2014-08-22: 2 via ORAL
  Filled 2014-08-21: qty 2
  Filled 2014-08-21 (×2): qty 1

## 2014-08-21 MED ORDER — ONDANSETRON HCL 4 MG PO TABS: 4.0000 mg | ORAL_TABLET | Freq: Four times a day (QID) | ORAL | Status: DC | PRN

## 2014-08-21 MED ORDER — SODIUM CHLORIDE 0.9 % IV BOLUS (SEPSIS)
1000.0000 mL | Freq: Once | INTRAVENOUS | Status: AC
  Administered 2014-08-21 (×2): 1000 mL via INTRAVENOUS

## 2014-08-21 MED ORDER — ONDANSETRON HCL 4 MG/2ML IJ SOLN
4.0000 mg | Freq: Four times a day (QID) | INTRAMUSCULAR | Status: DC | PRN
  Administered 2014-08-21 – 2014-08-22 (×2): 4 mg via INTRAVENOUS
  Filled 2014-08-21 (×3): qty 2

## 2014-08-21 MED ORDER — ACETAMINOPHEN 500 MG PO TABS
1000.0000 mg | ORAL_TABLET | Freq: Four times a day (QID) | ORAL | Status: DC | PRN
  Administered 2014-08-22: 1000 mg via ORAL
  Filled 2014-08-21 (×2): qty 2

## 2014-08-21 MED ORDER — LACTATED RINGERS IV SOLN
INTRAVENOUS | Status: DC
  Administered 2014-08-21 (×2): via INTRAVENOUS

## 2014-08-21 MED ORDER — DEXTROSE 5 % IV SOLN
1.0000 g | Freq: Two times a day (BID) | INTRAVENOUS | Status: DC
  Administered 2014-08-21 – 2014-08-22 (×4): 1 g via INTRAVENOUS
  Filled 2014-08-21 (×5): qty 1

## 2014-08-21 MED ORDER — AZITHROMYCIN 500 MG PO TABS
500.0000 mg | ORAL_TABLET | Freq: Every day | ORAL | Status: DC
  Filled 2014-08-21 (×2): qty 1

## 2014-08-21 MED ORDER — CEFTRIAXONE SODIUM 1 G IJ SOLR
1.0000 g | Freq: Once | INTRAMUSCULAR | Status: AC
  Administered 2014-08-21: 1 g via INTRAVENOUS
  Filled 2014-08-21: qty 10

## 2014-08-21 NOTE — ED Notes (Signed)
Patient transported to Ultrasound with chaperone.

## 2014-08-21 NOTE — ED Notes (Signed)
Patient returned from ultrasound, placed back on monitor

## 2014-08-21 NOTE — ED Notes (Signed)
Pt in ultrasound

## 2014-08-21 NOTE — MAU Provider Note (Signed)
  History     CSN: [id number]  Arrival date and time: 08/20/14 2259   None     Chief Complaint  Patient presents with  . Abdominal Pain   HPI Whitney Winters 23 y.o. G1P0 @ roughly 12 weeks is [facility] for PID in pregnancy.  She has had lower abdominal pain for several weeks and it has been worsening.  She notes associated vaginal discharge, nausea, vomiting and dysuria.  She denies fever, headache, vaginal bleeding.  She is not yet feeling fetal movement.   She was transferred here with IV (x2) in place for 1 gram Rocephin and 1 gram azithromycin.   She reports one previous pregnancy that was uneventful.  OB History    Gravida Para Term Preterm AB TAB SAB Ectopic Multiple Living   1               Past Medical History  Diagnosis Date  . Kidney stones   . UTI (urinary tract infection)   . Renal stones     History reviewed. No pertinent past surgical history.  History reviewed. No pertinent family history.  History  Substance Use Topics  . Smoking status: Never Smoker   . Smokeless tobacco: Not on file  . Alcohol Use: No    Allergies: No Known Allergies  Prescriptions prior to admission  Medication Sig Dispense Refill Last Dose  . doxycycline (VIBRAMYCIN) 100 MG capsule Take 1 capsule (100 mg total) by mouth 2 (two) times daily. (Patient not taking: Reported on 08/21/2014) 28 capsule 0 Not Taking at Unknown time    ROS Pertinent ROS in HPI  Physical Exam   Blood pressure 120/76, pulse 111, temperature 98.2 F (36.8 C), temperature source Oral, resp. rate 18, height 5\' 1"  (1.549 m), weight 100 lb (45.36 kg), last menstrual period 05/31/2014, SpO2 100 %.  Physical Exam  Constitutional: She is oriented to person, place, and time. She appears well-developed and well-nourished.  HENT:  Head: Normocephalic and atraumatic.  Eyes: EOM are normal.  Neck: Normal range of motion.  Cardiovascular: Normal rate.   Respiratory: Effort normal.  No respiratory distress.  GI: Soft. She exhibits no distension. There is tenderness. There is no rebound and no guarding.  Significant tenderness all across lower abdomen and suprapubic region  Genitourinary:  Copious yellowish white discharge in vagina.  This is removed to visualize mucopurulent discharge in erythematous cervical os.   CMT is present.  Tenderness throughout adnexa bilaterally as well.  No mass appreciated.  Musculoskeletal: Normal range of motion.  Neurological: She is alert and oriented to person, place, and time.  Skin: Skin is warm and dry.  Psychiatric: She has a normal mood and affect.    MAU Course  Procedures  MDM Discussed with Dr. Macon LargeAnyanwu whom advises for admission of patient.  She will put in orders.    Assessment and Plan  A: PID in pregnancy  P: Admit  Bertram Denvereague Clark, Albion Weatherholtz E 08/21/2014, 6:52 AM

## 2014-08-21 NOTE — ED Notes (Signed)
Azithromycin 500 mg still pending, first 500 mg given.

## 2014-08-21 NOTE — H&P (Signed)
Obstetric History and Physical  History    CSN: [id number]  Arrival date and time: 08/20/14 2259  None    Chief Complaint  Patient presents with  . Abdominal Pain   HPI Whitney Winters 23 y.o. G1P0 @ roughly 12 weeks is [facility] for PID in pregnancy. She has had lower abdominal pain for several weeks and it has been worsening. She notes associated vaginal discharge, nausea, vomiting and dysuria. She denies fever, headache, vaginal bleeding. She is not yet feeling fetal movement.  She was transferred here with IV (x2) in place for 1 gram Rocephin and 1 gram azithromycin.  She reports one previous pregnancy that was uneventful.  OB History    Gravida Para Term Preterm AB TAB SAB Ectopic Multiple Living   1               Past Medical History  Diagnosis Date  . Kidney stones   . UTI (urinary tract infection)   . Renal stones     History reviewed. No pertinent past surgical history.  History reviewed. No pertinent family history.  History  Substance Use Topics  . Smoking status: Never Smoker   . Smokeless tobacco: Not on file  . Alcohol Use: No    Allergies: No Known Allergies  Prescriptions prior to admission  Medication Sig Dispense Refill Last Dose  . doxycycline (VIBRAMYCIN) 100 MG capsule Take 1 capsule (100 mg total) by mouth 2 (two) times daily. (Patient not taking: Reported on 08/21/2014) 28 capsule 0 Not Taking at Unknown time    ROS Pertinent ROS in HPI  Physical Exam   Blood pressure 120/76, pulse 111, temperature 98.2 F (36.8 C), temperature source Oral, resp. rate 18, height 5\' 1"  (1.549 m), weight 100 lb (45.36 kg), last menstrual period 05/31/2014, SpO2 100 %.  Physical Exam  Constitutional: She is oriented to person, place, and time. She appears well-developed and well-nourished.  HENT:  Head: Normocephalic and atraumatic.  Eyes:  EOM are normal.  Neck: Normal range of motion.  Cardiovascular: Normal rate.  Respiratory: Effort normal. No respiratory distress.  GI: Soft. She exhibits no distension. There is tenderness. There is no rebound and no guarding.  Significant tenderness all across lower abdomen and suprapubic region  Genitourinary:  Copious yellowish white discharge in vagina. This is removed to visualize mucopurulent discharge in erythematous cervical os.  CMT is present. Tenderness throughout adnexa bilaterally as well. No mass appreciated.  Musculoskeletal: Normal range of motion.  Neurological: She is alert and oriented to person, place, and time.  Skin: Skin is warm and dry.  Psychiatric: She has a normal mood and affect.    MAU Course  Procedures  MDM Discussed with Dr. Macon LargeAnyanwu whom advises for admission of patient. She will put in orders.   Assessment and Plan  A: PID in pregnancy  P: Admit  Bertram Denvereague Clark, Karen E 08/21/2014, 6:52 AM  Attestation of Attending Supervision of Advanced Practitioner (PA/CNM/NP): Evaluation and management procedures were performed by the Advanced Practitioner under my supervision and collaboration.  I have reviewed the Advanced Practitioner's note and chart, and I agree with the management and plan.  Admission orders placed.  Will continue close monitoring.  Ordered for IV Cefotetan and orl Azithromycin. Will follow up pending cultures.    Jaynie CollinsUGONNA  Jaydence Vanyo, MD, FACOG Attending Obstetrician & Gynecologist Faculty Practice, [facility]

## 2014-08-22 NOTE — Progress Notes (Signed)
Subjective:nausea improved and pain is better Patient reports nausea, tolerating PO and no problems voiding.    Objective: Blood pressure 95/75, pulse 92, temperature 98.6 F (37 C), temperature source Oral, resp. rate 16, height 5\' 1"  (1.549 m), weight 107 lb (48.535 kg), last menstrual period 05/31/2014, SpO2 100 %.  I have reviewed patient's vital signs, medications, labs and microbiology.  General: alert, cooperative and no distress GI: mild lower quadrant tenderness no guarding  CT and GC negative Assessment/Plan: Cervicitis/PID [id number] Continue present ABX    LOS: 1 day    ARNOLD,JAMES 08/22/2014, 9:38 AM

## 2014-08-23 DIAGNOSIS — O23511 Infections of cervix in pregnancy, first trimester: Secondary | ICD-10-CM

## 2014-08-23 DIAGNOSIS — Z3A13 13 weeks gestation of pregnancy: Secondary | ICD-10-CM

## 2014-08-23 DIAGNOSIS — N739 Female pelvic inflammatory disease, unspecified: Secondary | ICD-10-CM

## 2014-08-23 DIAGNOSIS — O9989 Other specified diseases and conditions complicating pregnancy, childbirth and the puerperium: Secondary | ICD-10-CM

## 2014-08-23 MED ORDER — AZITHROMYCIN 500 MG PO TABS: 500.0000 mg | ORAL_TABLET | Freq: Every day | ORAL | Status: DC

## 2014-08-23 NOTE — Progress Notes (Signed)
Discharge instructions reviewed with patient.  Patient states understanding of home care, medications, activity, signs/symptoms to report to MD and return MD office visit.  Patients significant other and family will assist with her care @ home.  Prescriptions transcribed to patients pharmacy, patient has all personal belongings and no home equipment needed.  Patient ambulated for discharge in stable condition with staff without incident.

## 2014-08-23 NOTE — Discharge Summary (Signed)
Physician Discharge Summary  Whitney Winters MRN: [id number] DOB/AGE: Nov 29, 1990 23 y.o.  Admit date: 08/20/2014 Discharge date: 08/23/2014  Admission Diagnoses:[redacted] week gestation with PID  Discharge Diagnoses: same Principal Problem:   Pelvic inflammatory disease (PID)   Discharged Condition: good  Hospital Course:  Arrival date and time: 08/20/14 2259  None    Chief Complaint  Whitney presents with  . Abdominal Pain   HPI Whitney Winters 23 y.o. G1P0 @ roughly 12 weeks is [facility] for PID in pregnancy. She has had lower abdominal pain for several weeks and it has been worsening. She notes associated vaginal discharge, nausea, vomiting and dysuria. She denies fever, headache, vaginal bleeding. She is not yet feeling fetal movement.  She was transferred here with IV (x2) in place for 1 gram Rocephin and 1 gram azithromycin.  She reports one previous pregnancy that was uneventful.  OB History    Gravida Para Term Preterm AB TAB SAB Ectopic Multiple Living   1               Past Medical History  Diagnosis Date  . Kidney stones   . UTI (urinary tract infection)   . Renal stones     History reviewed. No pertinent past surgical history.  History reviewed. No pertinent family history.  History  Substance Use Topics  . Smoking status: Never Smoker   . Smokeless tobacco: Not on file  . Alcohol Use: No    Allergies: No Known Allergies  Prescriptions prior to admission  Medication Sig Dispense Refill Last Dose  . doxycycline (VIBRAMYCIN) 100 MG capsule Take 1 capsule (100 mg total) by mouth 2 (two) times daily. (Whitney not taking: Reported on 08/21/2014) 28 capsule 0 Not Taking at Unknown time    ROS Pertinent ROS in HPI  Physical Exam   Blood pressure 120/76, pulse  111, temperature 98.2 F (36.8 C), temperature source Oral, resp. rate 18, height 5\' 1"  (1.549 m), weight 100 lb (45.36 kg), last menstrual period 05/31/2014, SpO2 100 %.  Physical Exam  Constitutional: She is oriented to person, place, and time. She appears well-developed and well-nourished.  HENT:  Head: Normocephalic and atraumatic.  Eyes: EOM are normal.  Neck: Normal range of motion.  Cardiovascular: Normal rate.  Respiratory: Effort normal. No respiratory distress.  GI: Soft. She exhibits no distension. There is tenderness. There is no rebound and no guarding.  Significant tenderness all across lower abdomen and suprapubic region  Genitourinary:  Copious yellowish white discharge in vagina. This is removed to visualize mucopurulent discharge in erythematous cervical os.  CMT is present. Tenderness throughout adnexa bilaterally as well. No mass appreciated.  Musculoskeletal: Normal range of motion.  Neurological: She is alert and oriented to person, place, and time.  Skin: Skin is warm and dry.  Psychiatric: She has a normal mood and affect.    MAU Course  Procedures  MDM Discussed with Dr. Macon LargeAnyanwu whom advises for admission of Whitney. She will put in orders.   Assessment and Plan  A: PID in pregnancy  P: Admit  Bertram Denvereague Clark, Karen E 08/21/2014, 6:52 AM  Attestation of Attending Supervision of Advanced Practitioner (PA/CNM/NP): Evaluation and management procedures were performed by the Advanced Practitioner under my supervision and collaboration. I have reviewed the Advanced Practitioner's note and chart, and I agree with the management and plan. Admission orders placed. Will continue close monitoring. Ordered for IV Cefotetan and orl Azithromycin. Will follow up pending  cultures.   Jaynie Collins, MD, FACOG Attending Obstetrician & Gynecologist Faculty Practice, [facility]     Whitney improved on this regimen and was  afebrile throughout hospitalization  Consults: None  Significant Diagnostic Studies:   CLINICAL DATA: Acute onset of generalized abdominal pain. Initial encounter.  EXAM: OBSTETRIC <14 WK [place] AND TRANSVAGINAL OB US  TECHNIQUE: Both transabdominal and transvaginal ultrasound examinations were performed for complete evaluation of the gestation as well as the maternal uterus, adnexal regions, and pelvic cul-de-sac. Transvaginal technique was performed to assess early pregnancy.  COMPARISON: None.  FINDINGS: Intrauterine gestational sac: Visualized/normal in shape.  Yolk sac: Yes  Embryo: Yes  Cardiac Activity: Yes  Heart Rate: 157 bpm  CRL: 6.5 cm 12 w 6 d [place] EDC: 02/27/2015  Maternal uterus/adnexae: No subchorionic hemorrhage is noted. Uterus otherwise unremarkable in appearance.  The ovaries are within normal limits. The right ovary measures 3.4 x 1.9 x 2.0 cm, while the left ovary measures 2.8 x 2.0 x 2.0 cm. No suspicious adnexal masses are seen; there is no evidence for ovarian torsion  No free fluid is seen within the pelvic cul-de-sac.  IMPRESSION: Single live intrauterine pregnancy noted, with a crown-rump length of 6.5 cm, corresponding to a gestational age of [redacted] weeks 6 days. This does not match the gestational age by LMP, and reflects a new estimated date of delivery of February 27, 2015.   Electronically Signed  By: Roanna Raider M.D.  On: 08/21/2014 02:05   CT and GC negative   Treatments: antibiotics: azithromycin and cefotetan  Discharge Exam: Blood pressure 100/60, pulse 72, temperature 98.3 F (36.8 C), temperature source Oral, resp. rate 16, height  (1.549 m), weight 48.535 kg (107 lb), last menstrual period 05/31/2014, SpO2 100 %. General appearance: alert, cooperative and no distress GI: soft, non-tender; bowel sounds normal; no masses,  no organomegaly  Disposition: 01-Home or Self  Care     Medication List    STOP taking these medications        doxycycline 100 MG capsule  Commonly known as:  VIBRAMYCIN      TAKE these medications        azithromycin 500 MG tablet  Commonly known as:  ZITHROMAX  Take 1 tablet (500 mg total) by mouth daily.           Follow-up Information    Follow up with OB Gyn [place] In 2 weeks.      Signed: ARNOLD,JAMES 08/23/2014, 7:12 AM

## 2014-08-23 NOTE — Discharge Instructions (Signed)
Pelvic Inflammatory Disease °Pelvic inflammatory disease (PID) refers to an infection in some or all of the female organs. The infection can be in the uterus, ovaries, fallopian tubes, or the surrounding tissues in the pelvis. PID can cause abdominal or pelvic pain that comes on suddenly (acute pelvic pain). PID is a serious infection because it can lead to lasting (chronic) pelvic pain or the inability to have children (infertile).  °CAUSES  °The infection is often caused by the normal bacteria found in the vaginal tissues. PID may also be caused by an infection that is spread during sexual contact. PID can also occur following:  °· The birth of a baby.   °· A miscarriage.   °· An abortion.   °· Major pelvic surgery.   °· The use of an intrauterine device (IUD).   °· A sexual assault.   °RISK FACTORS °Certain factors can put a person at higher risk for PID, such as: °· Being younger than 25 years. °· Being sexually active at a young age. °· Using nonbarrier contraception. °· Having multiple sexual partners. °· Having sex with someone who has symptoms of a genital infection. °· Using oral contraception. °Other times, certain behaviors can increase the possibility of getting PID, such as: °· Having sex during your period. °· Using a vaginal douche. °· Having an intrauterine device (IUD) in place. °SYMPTOMS  °· Abdominal or pelvic pain.   °· Fever.   °· Chills.   °· Abnormal vaginal discharge. °· Abnormal uterine bleeding.   °· Unusual pain shortly after finishing your period. °DIAGNOSIS  °Your caregiver will choose some of the following methods to make a diagnosis, such as:  °· Performing a physical exam and history. A pelvic exam typically reveals a very tender uterus and surrounding pelvis.   °· Ordering laboratory tests including a pregnancy test, blood tests, and urine test.  °· Ordering cultures of the vagina and cervix to check for a sexually transmitted infection (STI). °· Performing an ultrasound.    °· Performing a laparoscopic procedure to look inside the pelvis.   °TREATMENT  °· Antibiotic medicines may be prescribed and taken by mouth.   °· Sexual partners may be treated when the infection is caused by a sexually transmitted disease (STD).   °· Hospitalization may be needed to give antibiotics intravenously. °· Surgery may be needed, but this is rare. °It may take weeks until you are completely well. If you are diagnosed with PID, you should also be checked for human immunodeficiency virus (HIV).   °HOME CARE INSTRUCTIONS  °· If given, take your antibiotics as directed. Finish the medicine even if you start to feel better.   °· Only take over-the-counter or prescription medicines for pain, discomfort, or fever as directed by your caregiver.   °· Do not have sexual intercourse until treatment is completed or as directed by your caregiver. If PID is confirmed, your recent sexual partner(s) will need treatment.   °· Keep your follow-up appointments. °SEEK MEDICAL CARE IF:  °· You have increased or abnormal vaginal discharge.   °· You need prescription medicine for your pain.   °· You vomit.   °· You cannot take your medicines.   °· Your partner has an STD.   °SEEK IMMEDIATE MEDICAL CARE IF:  °· You have a fever.   °· You have increased abdominal or pelvic pain.   °· You have chills.   °· You have pain when you urinate.   °· You are not better after 72 hours following treatment.   °MAKE SURE YOU:  °· Understand these instructions. °· Will watch your condition. °· Will get help right away if you are not doing well or get worse. °  Document Released: 06/26/2005 Document Revised: 10/21/2012 Document Reviewed: 06/22/2011 °ExitCare® Patient Information ©2015 [employer]. This information is not intended to replace advice given to you by your health care provider. Make sure you discuss any questions you have with your health care provider. ° °

## 2014-09-22 ENCOUNTER — Encounter (HOSPITAL_COMMUNITY): Payer: Self-pay | Admitting: *Deleted

## 2014-09-22 ENCOUNTER — Inpatient Hospital Stay (HOSPITAL_COMMUNITY)
Admission: AD | Admit: 2014-09-22 | Discharge: 2014-09-22 | Disposition: A | Discharge status: Home / Self Care | Payer: Medicaid Other | Source: Ambulatory Visit | Attending: Obstetrics & Gynecology | Admitting: Obstetrics & Gynecology

## 2014-09-22 DIAGNOSIS — N898 Other specified noninflammatory disorders of vagina: Secondary | ICD-10-CM | POA: Diagnosis not present

## 2014-09-22 DIAGNOSIS — O9989 Other specified diseases and conditions complicating pregnancy, childbirth and the puerperium: Secondary | ICD-10-CM | POA: Diagnosis present

## 2014-09-22 DIAGNOSIS — Z3A17 17 weeks gestation of pregnancy: Secondary | ICD-10-CM | POA: Diagnosis not present

## 2014-09-22 DIAGNOSIS — Z87442 Personal history of urinary calculi: Secondary | ICD-10-CM | POA: Insufficient documentation

## 2014-09-22 DIAGNOSIS — O26892 Other specified pregnancy related conditions, second trimester: Secondary | ICD-10-CM | POA: Diagnosis not present

## 2014-09-22 HISTORY — DX: Calculus of kidney: N20.0

## 2014-09-22 HISTORY — DX: Renal tubulo-interstitial disease, unspecified: N15.9

## 2014-09-22 LAB — URINE MICROSCOPIC-ADD ON

## 2014-09-22 LAB — URINALYSIS, ROUTINE W REFLEX MICROSCOPIC
Bilirubin Urine: NEGATIVE
Glucose, UA: NEGATIVE mg/dL
HGB URINE DIPSTICK: NEGATIVE
Ketones, ur: NEGATIVE mg/dL
NITRITE: NEGATIVE
PROTEIN: NEGATIVE mg/dL
SPECIFIC GRAVITY, URINE: 1.015 (ref 1.005–1.030)
Urobilinogen, UA: 0.2 mg/dL (ref 0.0–1.0)
pH: 6 (ref 5.0–8.0)

## 2014-09-22 LAB — POCT FERN TEST: POCT FERN TEST: NEGATIVE

## 2014-09-22 NOTE — MAU Provider Note (Signed)
History     CSN: [id number]  Arrival date and time: 09/22/14 1102   First Provider Initiated Contact with Patient 09/22/14 1150      Chief Complaint  Patient presents with  . Rupture of Membranes  . Abdominal Pain   HPI  Whitney Winters is a 24 y.o. G2P0 at 7961w3d who presents to MAU today with complaint of LOF. The patient states that she woke up this morning and noted a moderate amount of clear discharge on her bed. She denies continued LOF, vaginal bleeding, UTI symptoms or N/V/D. She states mild bilateral lower abdominal pain rated at 3/10 now that started today. She has a history of PID with this pregnancy that was treated last month. She states that she has completed all antibiotics. Cultures from that encounter were all negative. The patient has not started prenatal care yet.   OB History    Gravida Para Term Preterm AB TAB SAB Ectopic Multiple Living   2         1      Past Medical History  Diagnosis Date  . Kidney stones   . UTI (urinary tract infection)   . Renal stones   . Kidney stone   . Kidney infection     No past surgical history on file.  Family History  Problem Relation Age of Onset  . Alcohol abuse Neg Hx     History  Substance Use Topics  . Smoking status: Never Smoker   . Smokeless tobacco: Not on file  . Alcohol Use: No    Allergies: No Known Allergies  No prescriptions prior to admission    Review of Systems  Constitutional: Negative for fever and malaise/fatigue.  Gastrointestinal: Positive for abdominal pain. Negative for nausea and vomiting.  Genitourinary: Negative for dysuria, urgency and frequency.       Neg - vaginal bleeding + vaginal discharge   Physical Exam   Blood pressure 116/59, pulse 90, temperature 97.7 F (36.5 C), temperature source Oral, resp. rate 16, height 5\' 2"  (1.575 m), weight 111 lb 9.6 oz (50.621 kg), last menstrual period 05/31/2014.  Physical Exam  Constitutional: She is oriented to person,  place, and time. She appears well-developed and well-nourished. No distress.  HENT:  Head: Normocephalic.  Cardiovascular: Normal rate.   Respiratory: Effort normal.  GI: Soft. She exhibits no distension and no mass. There is no tenderness. There is no rebound.  Genitourinary: Uterus is enlarged (appropriate for GA). Uterus is not tender. Cervix exhibits no motion tenderness, no discharge and no friability. No bleeding in the vagina. Vaginal discharge (small amount of thin, white and clear mucous discharge noted) found.  Cervix: closed, thick, posterior  Neurological: She is alert and oriented to person, place, and time.  Skin: Skin is warm and dry. No erythema.  Psychiatric: She has a normal mood and affect.   Results for orders placed or performed during the hospital encounter of 09/22/14 (from the past 24 hour(s))  Urinalysis, Routine w reflex microscopic     Status: Abnormal   Collection Time: 09/22/14 11:23 AM  Result Value Ref Range   Color, Urine YELLOW YELLOW   APPearance CLEAR CLEAR   Specific Gravity, Urine 1.015 1.005 - 1.030   pH 6.0 5.0 - 8.0   Glucose, UA NEGATIVE NEGATIVE mg/dL   Hgb urine dipstick NEGATIVE NEGATIVE   Bilirubin Urine NEGATIVE NEGATIVE   Ketones, ur NEGATIVE NEGATIVE mg/dL   Protein, ur NEGATIVE NEGATIVE mg/dL   Urobilinogen, UA  0.2 0.0 - 1.0 mg/dL   Nitrite NEGATIVE NEGATIVE   Leukocytes, UA SMALL (A) NEGATIVE  Urine microscopic-add on     Status: None   Collection Time: 09/22/14 11:23 AM  Result Value Ref Range   Squamous Epithelial / LPF RARE RARE   WBC, UA 0-2 <3 WBC/hpf  Fern Test     Status: Normal   Collection Time: 09/22/14 12:42 PM  Result Value Ref Range   POCT Fern Test Negative = intact amniotic membranes     MAU Course  Procedures None  MDM FHR - 153 bpm with doppler Fern - negative Assessment and Plan  A: SIUP at [redacted]w[redacted]d Vaginal discharge in pregnancy   P: Discharge home Letter of confirmation and list of are OB providers  given Second trimester warning signs discussed Patient may return to MAU as needed or if her condition were to change or worsen   Marny Lowenstein, PA-C  09/22/2014, 1:56 PM

## 2014-09-22 NOTE — Progress Notes (Signed)
Fern slide obtained. No pooling noted. White d/c

## 2014-09-22 NOTE — Progress Notes (Signed)
Vonzella NippleJulie Wenzel PA in to see pt and discuss d/c plan.Written and verbal d/c instructions given and understanding voiced. List of providers given and preg. Verification letter

## 2014-09-22 NOTE — MAU Note (Signed)
Pt states she was completely wet when she woke up, went to restroom & voided, but was still leaking ? Fluid - clear.  Lower abd pain, no bleeding.

## 2014-09-22 NOTE — Discharge Instructions (Signed)
Abdominal Pain During Pregnancy °Belly (abdominal) pain is common during pregnancy. Most of the time, it is not a serious problem. Other times, it can be a sign that something is wrong with the pregnancy. Always tell your doctor if you have belly pain. °HOME CARE °Monitor your belly pain for any changes. The following actions may help you feel better: °· Do not have sex (intercourse) or put anything in your vagina until you feel better. °· Rest until your pain stops. °· Drink clear fluids if you feel sick to your stomach (nauseous). Do not eat solid food until you feel better. °· Only take medicine as told by your doctor. °· Keep all doctor visits as told. °GET HELP RIGHT AWAY IF:  °· You are bleeding, leaking fluid, or pieces of tissue come out of your vagina. °· You have more pain or cramping. °· You keep throwing up (vomiting). °· You have pain when you pee (urinate) or have blood in your pee. °· You have a fever. °· You do not feel your baby moving as much. °· You feel very weak or feel like passing out. °· You have trouble breathing, with or without belly pain. °· You have a very bad headache and belly pain. °· You have fluid leaking from your vagina and belly pain. °· You keep having watery poop (diarrhea). °· Your belly pain does not go away after resting, or the pain gets worse. °MAKE SURE YOU:  °· Understand these instructions. °· Will watch your condition. °· Will get help right away if you are not doing well or get worse. °Document Released: 06/14/2009 Document Revised: 02/26/2013 Document Reviewed: 01/23/2013 °ExitCare® Patient Information ©2015 [employer]. This information is not intended to replace advice given to you by your health care provider. Make sure you discuss any questions you have with your health care provider. ° °

## 2014-11-20 ENCOUNTER — Inpatient Hospital Stay (HOSPITAL_COMMUNITY)
Admission: AD | Admit: 2014-11-20 | Discharge: 2014-11-20 | Disposition: A | Discharge status: Home / Self Care | Payer: Medicaid Other | Source: Ambulatory Visit | Attending: Obstetrics & Gynecology | Admitting: Obstetrics & Gynecology

## 2014-11-20 ENCOUNTER — Encounter (HOSPITAL_COMMUNITY): Payer: Self-pay | Admitting: *Deleted

## 2014-11-20 DIAGNOSIS — O2342 Unspecified infection of urinary tract in pregnancy, second trimester: Secondary | ICD-10-CM | POA: Diagnosis not present

## 2014-11-20 DIAGNOSIS — Z3A25 25 weeks gestation of pregnancy: Secondary | ICD-10-CM | POA: Insufficient documentation

## 2014-11-20 DIAGNOSIS — O4702 False labor before 37 completed weeks of gestation, second trimester: Secondary | ICD-10-CM

## 2014-11-20 HISTORY — DX: Female pelvic inflammatory disease, unspecified: N73.9

## 2014-11-20 LAB — URINALYSIS, ROUTINE W REFLEX MICROSCOPIC
Bilirubin Urine: NEGATIVE
Glucose, UA: NEGATIVE mg/dL
KETONES UR: NEGATIVE mg/dL
NITRITE: NEGATIVE
PROTEIN: NEGATIVE mg/dL
Urobilinogen, UA: 0.2 mg/dL (ref 0.0–1.0)
pH: 6 (ref 5.0–8.0)

## 2014-11-20 LAB — URINE MICROSCOPIC-ADD ON

## 2014-11-20 LAB — WET PREP, GENITAL
CLUE CELLS WET PREP: NONE SEEN
TRICH WET PREP: NONE SEEN
YEAST WET PREP: NONE SEEN

## 2014-11-20 MED ORDER — NIFEDIPINE 10 MG PO CAPS
10.0000 mg | ORAL_CAPSULE | ORAL | Status: AC | PRN
  Administered 2014-11-20 (×3): 10 mg via ORAL
  Filled 2014-11-20 (×3): qty 1

## 2014-11-20 MED ORDER — PHENAZOPYRIDINE HCL 200 MG PO TABS: 200.0000 mg | ORAL_TABLET | Freq: Three times a day (TID) | ORAL | Status: DC

## 2014-11-20 MED ORDER — NITROFURANTOIN MONOHYD MACRO 100 MG PO CAPS
100.0000 mg | ORAL_CAPSULE | Freq: Two times a day (BID) | ORAL | Status: DC
  Administered 2014-11-20: 100 mg via ORAL
  Filled 2014-11-20: qty 1

## 2014-11-20 MED ORDER — NITROFURANTOIN MONOHYD MACRO 100 MG PO CAPS: 100.0000 mg | ORAL_CAPSULE | Freq: Two times a day (BID) | ORAL | Status: DC

## 2014-11-20 MED ORDER — LACTATED RINGERS IV BOLUS (SEPSIS)
1000.0000 mL | Freq: Once | INTRAVENOUS | Status: AC
  Administered 2014-11-20: 1000 mL via INTRAVENOUS

## 2014-11-20 NOTE — MAU Note (Addendum)
Having lower abdominal pain and contractions since 8 this morning. Denies bright red vaginal bleeding.  Positive fetal movement. Denies SROM/LOF  Denies any complications of pregnancy except kidney infection at 13 weeks. Pt says it is hard to pee, unable to collect urine specimen at this time.

## 2014-11-20 NOTE — MAU Provider Note (Signed)
History     CSN: [id number]  Arrival date and time: 11/20/14 1535   First Provider Initiated Contact with Patient 11/20/14 1603      Chief Complaint  Patient presents with  . Contractions  . Abdominal Pain   HPI Patient is 24 y.o. 602P0 6711w6d here with complaints of abdominal pain, dysuria, hematuria, nausea.  Patient reports that symptoms started this am.  She states that she thought she was having braxton hicks but that when she went to urinate there was blood when she wiped.  She reports a h/o kidney stones prior to pregnancy and a kidney infection during this pregnancy.  Patient has a known h/o PID during this pregnancy.  She reports that she has been able to eat and drink but has not been able to urinate since this am.  She denies fevers, chills, back pain.  +FM, denies LOF, vaginal discharge.  OB History    Gravida Para Term Preterm AB TAB SAB Ectopic Multiple Living   2         1      Past Medical History  Diagnosis Date  . Kidney stones   . UTI (urinary tract infection)   . Renal stones   . Kidney stone   . Kidney infection   . Pelvic inflammatory disease     History reviewed. No pertinent past surgical history.  Family History  Problem Relation Age of Onset  . Alcohol abuse Neg Hx     History  Substance Use Topics  . Smoking status: Never Smoker   . Smokeless tobacco: Never Used  . Alcohol Use: No    Allergies: No Known Allergies  No prescriptions prior to admission    Review of Systems  Constitutional: Negative for fever and chills.  Gastrointestinal: Positive for nausea and abdominal pain. Negative for vomiting, diarrhea and blood in stool.  Genitourinary: Positive for dysuria, urgency, frequency and hematuria. Negative for flank pain.       +FM, no LOF, vaginal discharge  Musculoskeletal: Negative for myalgias and back pain.  Neurological: Negative for weakness.   Physical Exam   Height 5\' 2"  (1.575 m), weight 116 lb 3.2 oz (52.708 kg), last  menstrual period 05/31/2014.  Physical Exam  Constitutional: She is oriented to person, place, and time. She appears well-developed and well-nourished.  HENT:  Head: Normocephalic and atraumatic.  Eyes: EOM are normal. No scleral icterus.  Neck: Normal range of motion. Neck supple.  Cardiovascular: Normal rate, regular rhythm, normal heart sounds and intact distal pulses.   Respiratory: Effort normal and breath sounds normal.  GI: Soft. There is tenderness (suprapubic TTP). There is no rebound and no guarding.  gravid  Genitourinary: Vaginal discharge found.  Musculoskeletal: Normal range of motion. She exhibits no edema or tenderness.  No CVA TTP  Neurological: She is alert and oriented to person, place, and time.  Skin: Skin is warm and dry.  Psychiatric: She has a normal mood and affect. Her behavior is normal. Judgment and thought content normal.  Dilation: Fingertip Exam by:: Philipp DeputyKim Shaniquia Brafford, CNM   Fetal monitoringBaseline: 145 bpm, Variability: Good {> 6 bpm) and Accelerations: Reactive Uterine activity: Frequency: irregular Every 3-8 minutes  Results for orders placed or performed during the hospital encounter of 11/20/14 (from the past 24 hour(s))  Urinalysis, Routine w reflex microscopic     Status: Abnormal   Collection Time: 11/20/14  4:16 PM  Result Value Ref Range   Color, Urine YELLOW YELLOW   APPearance CLEAR  CLEAR   Specific Gravity, Urine >1.030 (H) 1.005 - 1.030   pH 6.0 5.0 - 8.0   Glucose, UA NEGATIVE NEGATIVE mg/dL   Hgb urine dipstick MODERATE (A) NEGATIVE   Bilirubin Urine NEGATIVE NEGATIVE   Ketones, ur NEGATIVE NEGATIVE mg/dL   Protein, ur NEGATIVE NEGATIVE mg/dL   Urobilinogen, UA 0.2 0.0 - 1.0 mg/dL   Nitrite NEGATIVE NEGATIVE   Leukocytes, UA SMALL (A) NEGATIVE  Urine microscopic-add on     Status: Abnormal   Collection Time: 11/20/14  4:16 PM  Result Value Ref Range   Squamous Epithelial / LPF FEW (A) RARE   WBC, UA 3-6 <3 WBC/hpf   RBC / HPF 3-6 <3  RBC/hpf   Bacteria, UA MANY (A) RARE   Urine-Other MUCOUS PRESENT   Wet prep, genital     Status: Abnormal   Collection Time: 11/20/14  4:36 PM  Result Value Ref Range   Yeast Wet Prep HPF POC NONE SEEN NONE SEEN   Trich, Wet Prep NONE SEEN NONE SEEN   Clue Cells Wet Prep HPF POC NONE SEEN NONE SEEN   WBC, Wet Prep HPF POC MANY (A) NONE SEEN    MAU Course  Procedures  MDM NST reactive UA with OB Ucx GC/CT Wet prep Procardia x3 IV hydration with LR x1L Macrobid  Assessment and Plan  UTI in pregnancy: -Macrobid BID x7d/Pyridium x 3d -UCx pending, GC/CT pending -Return precautions reviewed  Threatened premature labor, 2nd trimester: No change in cervical exam upon recheck.  Patient's UC responded well to Procardia.   -Patient to hydrate  -Return precautions reviewed -Patient to f/u with OB provider as scheduled  Raliegh IpGottschalk, Ashly M, DO 11/20/2014, 4:04 PM   CNM attestation:  I have seen and examined this patient; I agree with above documentation in the resident's note.   Whitney Winters is a 24 y.o. G2P0 @ 25.6wks reporting abd pain, nausea, dysuria +FM, denies LOF, VB,  vaginal discharge.  PE: BP 123/59 mmHg  Pulse 93  Temp(Src) 98.3 F (36.8 C) (Oral)  Resp 18  Ht 5\' 2"  (1.575 m)  Wt 116 lb 3.2 oz (52.708 kg)  BMI 21.25 kg/m2  SpO2 100%  LMP 05/31/2014 Gen: appears mod uncomfortable initially, resolved after Procardia Resp: normal effort, no distress Abd: gravid Cx: FT/50 (examined by me x 2- unchanged)  ROS, labs, PMH reviewed NST reactive +accels, no decels Toco initially w/ reg ctx that still cont in somewhat of a reg pattern after Procardia x 3, but were more infrequent and less intense per pt  Assess/Plan: IUP@25 .6wks UTI Threatened PTL  - fetal kick counts reinforced, preterm labor precautions - rx Macrobid and Pyridium - continue routine follow up in OB clinic ([facility])  Cam HaiSHAW, Devonda Pequignot, CNM 11:41 PM

## 2014-11-20 NOTE — Discharge Instructions (Signed)
Infección urinaria  °(Urinary Tract Infection) ° La infección urinaria puede ocurrir en cualquier lugar del tracto urinario. El tracto urinario es un sistema de drenaje del cuerpo por el que se eliminan los desechos y el exceso de agua. El tracto urinario está formado por dos riñones, dos uréteres, la vejiga y la uretra. Los riñones son órganos que tienen forma de frijol. Cada riñón tiene aproximadamente el tamaño del puño. Están situados debajo de las costillas, uno a cada lado de la columna vertebral °CAUSAS  °La causa de la infección son los microbios, que son organismos microscópicos, que incluyen hongos, virus, y bacterias. Estos organismos son tan pequeños que sólo pueden verse a través del microscopio. Las bacterias son los microorganismos que más comúnmente causan infecciones urinarias.  °SÍNTOMAS  °Los síntomas pueden variar según la edad y el sexo del paciente y por la ubicación de la infección. Los síntomas en las mujeres jóvenes incluyen la necesidad frecuente e intensa de orinar y una sensación dolorosa de ardor en la vejiga o en la uretra durante la micción. Las mujeres y los hombres mayores podrán sentir cansancio, temblores y debilidad y sentir dolores musculares y dolor abdominal. Si tiene fiebre, puede significar que la infección está en los riñones. Otros síntomas son dolor en la espalda o en los lados debajo de las costillas, náuseas y vómitos.  °DIAGNÓSTICO  °Para diagnosticar una infección urinaria, el médico le preguntará acerca de sus síntomas. También le solicitará una muestra de orina. La muestra de orina se analiza para detectar bacterias y glóbulos blancos de la sangre. Los glóbulos blancos se forman en el organismo para ayudar a combatir las infecciones.  °TRATAMIENTO  °Por lo general, las infecciones urinarias pueden tratarse con medicamentos. Debido a que la mayoría de las infecciones son causadas por bacterias, por lo general pueden tratarse con antibióticos. La elección del  antibiótico y la duración del tratamiento dependerá de sus síntomas y el tipo de bacteria causante de la infección.  °INSTRUCCIONES PARA EL CUIDADO EN EL HOGAR  °· Si le recetaron antibióticos, tómelos exactamente como su médico le indique. Termine el medicamento aunque se sienta mejor después de haber tomado sólo algunos. °· Beba gran cantidad de líquido para mantener la orina de tono claro o color amarillo pálido. °· Evite la cafeína, el té y las bebidas gaseosas. Estas sustancias irritan la vejiga. °· Vaciar la vejiga con frecuencia. Evite retener la orina durante largos períodos. °· Vacíe la vejiga antes y después de tener relaciones sexuales. °· Después de mover el intestino, las mujeres deben higienizarse la región perineal desde adelante hacia atrás. Use sólo un papel tissue por vez. °SOLICITE ATENCIÓN MÉDICA SI:  °· Siente dolor en la espalda. °· Le sube la fiebre. °· Los síntomas no mejoran luego de 3 días. °SOLICITE ATENCIÓN MÉDICA DE INMEDIATO SI:  °· Siente dolor intenso en la espalda o en la zona inferior del abdomen. °· Comienza a sentir escalofríos. °· Tiene náuseas o vómitos. °· Tiene una sensación continua de quemazón o molestias al orinar. °ASEGÚRESE DE QUE:  °· Comprende estas instrucciones. °· Controlará su enfermedad. °· Solicitará ayuda de inmediato si no mejora o empeora. °Document Released: 04/05/2005 Document Revised: 03/20/2012 °ExitCare® Patient Information ©2015 [employer]. This information is not intended to replace advice given to you by your health care provider. Make sure you discuss any questions you have with your health care provider. ° °

## 2014-11-21 LAB — CULTURE, OB URINE
CULTURE: NO GROWTH
Colony Count: NO GROWTH

## 2014-11-23 LAB — GC/CHLAMYDIA PROBE AMP (~~LOC~~) NOT AT ARMC
Chlamydia: NEGATIVE
Neisseria Gonorrhea: NEGATIVE

## 2015-05-24 ENCOUNTER — Emergency Department (HOSPITAL_COMMUNITY)
Admission: EM | Admit: 2015-05-24 | Discharge: 2015-05-24 | Disposition: A | Discharge status: Home / Self Care | Payer: Medicaid Other | Attending: Emergency Medicine | Admitting: Emergency Medicine

## 2015-05-24 ENCOUNTER — Encounter (HOSPITAL_COMMUNITY): Payer: Self-pay | Admitting: *Deleted

## 2015-05-24 DIAGNOSIS — N390 Urinary tract infection, site not specified: Secondary | ICD-10-CM

## 2015-05-24 DIAGNOSIS — Z8742 Personal history of other diseases of the female genital tract: Secondary | ICD-10-CM | POA: Insufficient documentation

## 2015-05-24 DIAGNOSIS — Z3202 Encounter for pregnancy test, result negative: Secondary | ICD-10-CM | POA: Insufficient documentation

## 2015-05-24 DIAGNOSIS — Z87442 Personal history of urinary calculi: Secondary | ICD-10-CM | POA: Insufficient documentation

## 2015-05-24 LAB — URINALYSIS, ROUTINE W REFLEX MICROSCOPIC
Bilirubin Urine: NEGATIVE
Glucose, UA: NEGATIVE mg/dL
Hgb urine dipstick: NEGATIVE
Ketones, ur: 40 mg/dL — AB
Nitrite: POSITIVE — AB
PROTEIN: NEGATIVE mg/dL
Specific Gravity, Urine: 1.03 (ref 1.005–1.030)
UROBILINOGEN UA: 0.2 mg/dL (ref 0.0–1.0)
pH: 5.5 (ref 5.0–8.0)

## 2015-05-24 LAB — POC URINE PREG, ED: PREG TEST UR: NEGATIVE

## 2015-05-24 LAB — URINE MICROSCOPIC-ADD ON

## 2015-05-24 MED ORDER — NITROFURANTOIN MONOHYD MACRO 100 MG PO CAPS: 100.0000 mg | ORAL_CAPSULE | Freq: Two times a day (BID) | ORAL | Status: DC

## 2015-05-24 MED ORDER — PHENAZOPYRIDINE HCL 200 MG PO TABS: 200.0000 mg | ORAL_TABLET | Freq: Three times a day (TID) | ORAL | Status: DC

## 2015-05-24 NOTE — ED Provider Notes (Signed)
CSN: [id number]     Arrival date & time 05/24/15  1310 History  By signing my name below, I, Whitney Winters, attest that this documentation has been prepared under the direction and in the presence of [facility] Hospital[facility]ily Hayleen Clinkscales, PA-C. Electronically Signed: Ronney LionSuzanne Winters, ED Scribe. 05/24/2015. 4:41 PM.    Chief Complaint  Patient presents with  . Dysuria  . Back Pain   The history is provided by the patient. No language interpreter was used.    HPI Comments: Whitney Winters is a 24 y.o. female who presents to the Emergency Department complaining of dysuria that she describes "just hurts," and tight lower back pain, both with onset 5 days ago. She endorses associated urinary frequency and urgency and 3 episodes vomiting yesterday. Patient states she has been taken Azo for her symptoms. She denies any abdominal pain, fever, vaginal discharge, vaginal bleeding. She reports normal menstrual periods. She states her LMP was on 05/15/15. Patient states she is not currently breastfeeding.  Past Medical History  Diagnosis Date  . Kidney stones   . UTI (urinary tract infection)   . Renal stones   . Kidney stone   . Kidney infection   . Pelvic inflammatory disease    History reviewed. No pertinent past surgical history. Family History  Problem Relation Age of Onset  . Alcohol abuse Neg Hx    Social History  Substance Use Topics  . Smoking status: Never Smoker   . Smokeless tobacco: Never Used  . Alcohol Use: No   OB History    Gravida Para Term Preterm AB TAB SAB Ectopic Multiple Living   2         1     Review of Systems  Constitutional: Negative for fever.  Gastrointestinal: Positive for nausea and vomiting. Negative for abdominal pain.  Genitourinary: Positive for dysuria, urgency and frequency. Negative for vaginal bleeding and vaginal discharge.  Musculoskeletal: Positive for back pain.  Skin: Negative for rash.  Allergic/Immunologic: Negative for immunocompromised state.  Hematological: Does  not bruise/bleed easily.  All other systems reviewed and are negative.   Allergies  Review of patient's allergies indicates no known allergies.  Home Medications   Prior to Admission medications   Medication Sig Start Date End Date Taking? Authorizing Provider  nitrofurantoin, macrocrystal-monohydrate, (MACROBID) 100 MG capsule Take 1 capsule (100 mg total) by mouth every 12 (twelve) hours. 11/20/14   Arabella MerlesKimberly D Shaw, CNM  phenazopyridine (PYRIDIUM) 200 MG tablet Take 1 tablet (200 mg total) by mouth 3 (three) times daily. 11/20/14   Arabella MerlesKimberly D Shaw, CNM  Prenatal Vit-Fe Fumarate-FA (PRENATAL MULTIVITAMIN) TABS tablet Take 1 tablet by mouth daily at 12 noon.    Historical Provider, MD   BP 128/90 mmHg  Pulse 104  Temp(Src) 98.3 F (36.8 C) (Oral)  Resp 16  Ht 5' (1.524 m)  Wt 100 lb 8 oz (45.587 kg)  BMI 19.63 kg/m2  SpO2 98%  LMP 05/15/2015  Breastfeeding? Unknown Physical Exam  Constitutional: She appears well-developed and well-nourished. No distress.  HENT:  Head: Normocephalic and atraumatic.  Neck: Neck supple.  Cardiovascular: Normal rate and regular rhythm.   Pulmonary/Chest: Effort normal and breath sounds normal. No respiratory distress. She has no wheezes. She has no rales.  Abdominal: Soft. She exhibits no distension. There is no tenderness. There is no rebound and no guarding.  Genitourinary:  No CVA tenderness.  Neurological: She is alert.  Skin: She is not diaphoretic.  Nursing note and vitals reviewed.  ED Course  Procedures (including critical care time)  DIAGNOSTIC STUDIES: Oxygen Saturation is 98% on RA, normal by my interpretation.    COORDINATION OF CARE: 4:32 PM - Discussed treatment plan with pt at bedside which includes Rx antibiotics and pain-relieving medications. She declines any nausea medication at this time. Strict return precautions given, and pt instructed to return if she develops a fever or worsening GI symptoms. Pt verbalized  understanding and agreed to plan.   Labs Review Labs Reviewed  URINALYSIS, ROUTINE W REFLEX MICROSCOPIC (NOT AT [facility]) - Abnormal; Notable for the following:    Color, Urine AMBER (*)    APPearance HAZY (*)    Ketones, ur 40 (*)    Nitrite POSITIVE (*)    Leukocytes, UA SMALL (*)    All other components within normal limits  URINE MICROSCOPIC-ADD ON - Abnormal; Notable for the following:    Squamous Epithelial / LPF FEW (*)    Bacteria, UA FEW (*)    All other components within normal limits  POC URINE PREG, ED   MDM   Final diagnoses:  UTI (lower urinary tract infection)   Afebrile, nontoxic patient with urinary symptoms with back pain x 5 days.  UA c/w UTI.  No fever or e/o pyelo.  Urine sent for culture.   D/C home with macrobid, pyridium, PCP follow up.   Discussed result, findings, treatment, and follow up  with patient.  Pt given return precautions.  Pt verbalizes understanding and agrees with plan.        I personally performed the services described in this documentation, which was scribed in my presence. The recorded information has been reviewed and is accurate.     [name], PA-C 05/24/15 1711  Tilden Fossa, MD 05/28/15 [phone]

## 2015-05-24 NOTE — Discharge Instructions (Signed)
Read the information below.  Use the prescribed medication as directed.  Please discuss all new medications with your pharmacist.  You may return to the Emergency Department at any time for worsening condition or any new symptoms that concern you.   If you develop high fevers, worsening abdominal pain, uncontrolled vomiting, or are unable to tolerate fluids by mouth, return to the ER for a recheck.   ° ° °Urinary Tract Infection °Urinary tract infections (UTIs) can develop anywhere along your urinary tract. Your urinary tract is your body's drainage system for removing wastes and extra water. Your urinary tract includes two kidneys, two ureters, a bladder, and a urethra. Your kidneys are a pair of bean-shaped organs. Each kidney is about the size of your fist. They are located below your ribs, one on each side of your spine. °CAUSES °Infections are caused by microbes, which are microscopic organisms, including fungi, viruses, and bacteria. These organisms are so small that they can only be seen through a microscope. Bacteria are the microbes that most commonly cause UTIs. °SYMPTOMS  °Symptoms of UTIs may vary by age and gender of the patient and by the location of the infection. Symptoms in young women typically include a frequent and intense urge to urinate and a painful, burning feeling in the bladder or urethra during urination. Older women and men are more likely to be tired, shaky, and weak and have muscle aches and abdominal pain. A fever may mean the infection is in your kidneys. Other symptoms of a kidney infection include pain in your back or sides below the ribs, nausea, and vomiting. °DIAGNOSIS °To diagnose a UTI, your caregiver will ask you about your symptoms. Your caregiver will also ask you to provide a urine sample. The urine sample will be tested for bacteria and white blood cells. White blood cells are made by your body to help fight infection. °TREATMENT  °Typically, UTIs can be treated with  medication. Because most UTIs are caused by a bacterial infection, they usually can be treated with the use of antibiotics. The choice of antibiotic and length of treatment depend on your symptoms and the type of bacteria causing your infection. °HOME CARE INSTRUCTIONS °· If you were prescribed antibiotics, take them exactly as your caregiver instructs you. Finish the medication even if you feel better after you have only taken some of the medication. °· Drink enough water and fluids to keep your urine clear or pale yellow. °· Avoid caffeine, tea, and carbonated beverages. They tend to irritate your bladder. °· Empty your bladder often. Avoid holding urine for long periods of time. °· Empty your bladder before and after sexual intercourse. °· After a bowel movement, women should cleanse from front to back. Use each tissue only once. °SEEK MEDICAL CARE IF:  °· You have back pain. °· You develop a fever. °· Your symptoms do not begin to resolve within 3 days. °SEEK IMMEDIATE MEDICAL CARE IF:  °· You have severe back pain or lower abdominal pain. °· You develop chills. °· You have nausea or vomiting. °· You have continued burning or discomfort with urination. °MAKE SURE YOU:  °· Understand these instructions. °· Will watch your condition. °· Will get help right away if you are not doing well or get worse. °  °This information is not intended to replace advice given to you by your health care provider. Make sure you discuss any questions you have with your health care provider. °  °Document Released: 04/05/2005 Document Revised:   03/17/2015 Document Reviewed: 08/04/2011 °Elsevier Interactive Patient Education ©2016 [employer] ° °

## 2015-05-24 NOTE — ED Notes (Signed)
Pt reports pain with urination and lower back pain x 5 days.

## 2015-05-26 ENCOUNTER — Emergency Department (HOSPITAL_COMMUNITY)
Admission: EM | Admit: 2015-05-26 | Discharge: 2015-05-27 | Disposition: A | Discharge status: Home / Self Care | Payer: Medicaid Other | Attending: Emergency Medicine | Admitting: Emergency Medicine

## 2015-05-26 ENCOUNTER — Encounter (HOSPITAL_COMMUNITY): Payer: Self-pay | Admitting: Emergency Medicine

## 2015-05-26 DIAGNOSIS — Z87440 Personal history of urinary (tract) infections: Secondary | ICD-10-CM | POA: Insufficient documentation

## 2015-05-26 DIAGNOSIS — R51 Headache: Secondary | ICD-10-CM | POA: Insufficient documentation

## 2015-05-26 DIAGNOSIS — Z87442 Personal history of urinary calculi: Secondary | ICD-10-CM | POA: Insufficient documentation

## 2015-05-26 DIAGNOSIS — Z79899 Other long term (current) drug therapy: Secondary | ICD-10-CM | POA: Insufficient documentation

## 2015-05-26 DIAGNOSIS — R197 Diarrhea, unspecified: Secondary | ICD-10-CM | POA: Insufficient documentation

## 2015-05-26 DIAGNOSIS — Z87448 Personal history of other diseases of urinary system: Secondary | ICD-10-CM | POA: Insufficient documentation

## 2015-05-26 DIAGNOSIS — Z8742 Personal history of other diseases of the female genital tract: Secondary | ICD-10-CM | POA: Insufficient documentation

## 2015-05-26 DIAGNOSIS — N739 Female pelvic inflammatory disease, unspecified: Secondary | ICD-10-CM

## 2015-05-26 DIAGNOSIS — N941 Unspecified dyspareunia: Secondary | ICD-10-CM | POA: Insufficient documentation

## 2015-05-26 DIAGNOSIS — N12 Tubulo-interstitial nephritis, not specified as acute or chronic: Secondary | ICD-10-CM

## 2015-05-26 LAB — BASIC METABOLIC PANEL
Anion gap: 12 (ref 5–15)
BUN: 13 mg/dL (ref 6–20)
CALCIUM: 10.3 mg/dL (ref 8.9–10.3)
CO2: 25 mmol/L (ref 22–32)
CREATININE: 0.66 mg/dL (ref 0.44–1.00)
Chloride: 104 mmol/L (ref 101–111)
GFR calc Af Amer: >60 mL/min (ref >60)
GFR calc non Af Amer: >60 mL/min (ref >60)
Glucose, Bld: 79 mg/dL (ref 65–99)
POTASSIUM: 4 mmol/L (ref 3.5–5.1)
SODIUM: 141 mmol/L (ref 135–145)

## 2015-05-26 LAB — URINALYSIS, ROUTINE W REFLEX MICROSCOPIC
GLUCOSE, UA: 250 mg/dL — AB
Leukocytes, UA: NEGATIVE
NITRITE: NEGATIVE
PH: 7 (ref 5.0–8.0)
Protein, ur: 100 mg/dL — AB
SPECIFIC GRAVITY, URINE: 1.01 (ref 1.005–1.030)

## 2015-05-26 LAB — POC URINE PREG, ED: Preg Test, Ur: NEGATIVE

## 2015-05-26 LAB — URINE CULTURE: Culture: >=100000

## 2015-05-26 LAB — CBC WITH DIFFERENTIAL/PLATELET
Basophils Absolute: 0 10*3/uL (ref 0.0–0.1)
Basophils Relative: 0 %
Eosinophils Absolute: 0 10*3/uL (ref 0.0–0.7)
Eosinophils Relative: 0 %
HCT: 44.1 % (ref 36.0–46.0)
Hemoglobin: 13.7 g/dL (ref 12.0–15.0)
LYMPHS ABS: 2.5 10*3/uL (ref 0.7–4.0)
LYMPHS PCT: 31 %
MCH: 26.8 pg (ref 26.0–34.0)
MCHC: 31.1 g/dL (ref 30.0–36.0)
MCV: 86.3 fL (ref 78.0–100.0)
MONO ABS: 0.6 10*3/uL (ref 0.1–1.0)
Monocytes Relative: 7 %
Neutro Abs: 5 10*3/uL (ref 1.7–7.7)
Neutrophils Relative %: 62 %
Platelets: 325 10*3/uL (ref 150–400)
RBC: 5.11 MIL/uL (ref 3.87–5.11)
RDW: 12.6 % (ref 11.5–15.5)
WBC: 8.2 10*3/uL (ref 4.0–10.5)

## 2015-05-26 LAB — WET PREP, GENITAL
CLUE CELLS WET PREP: NONE SEEN
Sperm: NONE SEEN
TRICH WET PREP: NONE SEEN
YEAST WET PREP: NONE SEEN

## 2015-05-26 LAB — URINE MICROSCOPIC-ADD ON

## 2015-05-26 MED ORDER — DEXTROSE 5 % IV SOLN
1.0000 g | Freq: Once | INTRAVENOUS | Status: AC
  Administered 2015-05-26: 1 g via INTRAVENOUS
  Filled 2015-05-26: qty 10

## 2015-05-26 MED ORDER — TRAMADOL HCL 50 MG PO TABS: 50.0000 mg | ORAL_TABLET | Freq: Four times a day (QID) | ORAL | Status: AC | PRN

## 2015-05-26 MED ORDER — SODIUM CHLORIDE 0.9 % IV BOLUS (SEPSIS)
500.0000 mL | Freq: Once | INTRAVENOUS | Status: AC
  Administered 2015-05-26: 500 mL via INTRAVENOUS

## 2015-05-26 MED ORDER — PROMETHAZINE HCL 25 MG/ML IJ SOLN
25.0000 mg | Freq: Once | INTRAMUSCULAR | Status: AC
  Administered 2015-05-26: 25 mg via INTRAVENOUS
  Filled 2015-05-26: qty 1

## 2015-05-26 MED ORDER — MORPHINE SULFATE (PF) 4 MG/ML IV SOLN
2.0000 mg | Freq: Once | INTRAVENOUS | Status: AC
  Administered 2015-05-26: 2 mg via INTRAVENOUS
  Filled 2015-05-26: qty 1

## 2015-05-26 MED ORDER — ONDANSETRON HCL 4 MG/2ML IJ SOLN
4.0000 mg | Freq: Once | INTRAMUSCULAR | Status: AC
  Administered 2015-05-26: 4 mg via INTRAVENOUS
  Filled 2015-05-26: qty 2

## 2015-05-26 MED ORDER — DOXYCYCLINE HYCLATE 100 MG PO CAPS: 100.0000 mg | ORAL_CAPSULE | Freq: Two times a day (BID) | ORAL | Status: AC

## 2015-05-26 MED ORDER — ONDANSETRON HCL 4 MG PO TABS: 4.0000 mg | ORAL_TABLET | Freq: Three times a day (TID) | ORAL | Status: AC | PRN

## 2015-05-26 MED ORDER — HYDROMORPHONE HCL 1 MG/ML IJ SOLN
0.5000 mg | Freq: Once | INTRAMUSCULAR | Status: AC
  Administered 2015-05-26: 0.5 mg via INTRAVENOUS
  Filled 2015-05-26: qty 1

## 2015-05-26 MED ORDER — SODIUM CHLORIDE 0.9 % IV BOLUS (SEPSIS)
1000.0000 mL | Freq: Once | INTRAVENOUS | Status: AC
  Administered 2015-05-26: 1000 mL via INTRAVENOUS

## 2015-05-26 MED ORDER — METRONIDAZOLE 500 MG PO TABS
500.0000 mg | ORAL_TABLET | Freq: Once | ORAL | Status: AC
  Administered 2015-05-26: 500 mg via ORAL
  Filled 2015-05-26: qty 1

## 2015-05-26 MED ORDER — METRONIDAZOLE 500 MG PO TABS: 500.0000 mg | ORAL_TABLET | Freq: Two times a day (BID) | ORAL | Status: AC

## 2015-05-26 MED ORDER — DOXYCYCLINE HYCLATE 100 MG PO TABS
100.0000 mg | ORAL_TABLET | Freq: Once | ORAL | Status: AC
  Administered 2015-05-26: 100 mg via ORAL
  Filled 2015-05-26: qty 1

## 2015-05-26 NOTE — Discharge Instructions (Signed)
Pelvic Inflammatory Disease °Pelvic inflammatory disease (PID) refers to an infection in some or all of the female organs. The infection can be in the uterus, ovaries, fallopian tubes, or the surrounding tissues in the pelvis. PID can cause abdominal or pelvic pain that comes on suddenly (acute pelvic pain). PID is a serious infection because it can lead to lasting (chronic) pelvic pain or the inability to have children (infertility). °CAUSES °This condition is most often caused by an infection that is spread during sexual contact. However, the infection can also be caused by the normal bacteria that are found in the vaginal tissues if these bacteria travel upward into the reproductive organs. PID can also occur following: °· The birth of a baby. °· A miscarriage. °· An abortion. °· Major pelvic surgery. °· The use of an intrauterine device (IUD). °· A sexual assault. °RISK FACTORS °This condition is more likely to develop in women who: °· Are younger than 22 years of age. °· Are sexually active at a young age. °· Use nonbarrier contraception. °· Have multiple sexual partners. °· Have sex with someone who has symptoms of an STD (sexually transmitted disease). °· Use oral contraception. °At times, certain behaviors can also increase the possibility of getting PID, such as: °· Using a vaginal douche. °· Having an IUD in place. °SYMPTOMS °Symptoms of this condition include: °· Abdominal or pelvic pain. °· Fever. °· Chills. °· Abnormal vaginal discharge. °· Abnormal uterine bleeding. °· Unusual pain shortly after the end of a menstrual period. °· Painful urination. °· Pain with sexual intercourse. °· Nausea and vomiting. °DIAGNOSIS °To diagnose this condition, your health care provider will do a physical exam and take your medical history. A pelvic exam typically reveals great tenderness in the uterus and the surrounding pelvic tissues. You may also have tests, such as: °· Lab tests, including a pregnancy test, blood  tests, and urine test. °· Culture tests of the vagina and cervix to check for an STD. °· Ultrasound. °· A laparoscopic procedure to look inside the pelvis. °· Examining vaginal secretions under a microscope. °TREATMENT °Treatment for this condition may involve one or more approaches. °· Antibiotic medicines may be prescribed to be taken by mouth. °· Sexual partners may need to be treated if the infection is caused by an STD. °· For more severe cases, hospitalization may be needed to give antibiotics directly into a vein through an IV tube. °· Surgery may be needed if other treatments do not help, but this is rare. °It may take weeks until you are completely well. If you are diagnosed with PID, you should also be checked for human immunodeficiency virus (HIV). Your health care provider may test you for infection again 3 months after treatment. You should not have unprotected sex. °HOME CARE INSTRUCTIONS °· Take over-the-counter and prescription medicines only as told by your health care provider. °· If you were prescribed an antibiotic medicine, take it as told by your health care provider. Do not stop taking the antibiotic even if you start to feel better. °· Do not have sexual intercourse until treatment is completed or as told by your health care provider. If PID is confirmed, your recent sexual partners will need treatment, especially if you had unprotected sex. °· Keep all follow-up visits as told by your health care provider. This is important. °SEEK MEDICAL CARE IF: °· You have increased or abnormal vaginal discharge. °· Your pain does not improve. °· You vomit. °· You have a fever. °· You   cannot tolerate your medicines.  Your partner has an STD.  You have pain when you urinate. SEEK IMMEDIATE MEDICAL CARE IF:  You have increased abdominal or pelvic pain.  You have chills.  Your symptoms are not better in 72 hours even with treatment.   This information is not intended to replace advice given to  you by your health care provider. Make sure you discuss any questions you have with your health care provider.   Document Released: 06/26/2005 Document Revised: 03/17/2015 Document Reviewed: 08/03/2014 Elsevier Interactive Patient Education 2016 Else[employer] Inc.  Pyelonephritis, Adult Pyelonephritis is a kidney infection. The kidneys are the organs that filter a person's blood and move waste out of the bloodstream and into the urine. Urine passes from the kidneys, through the ureters, and into the bladder. There are two main types of pyelonephritis:  Infections that come on quickly without any warning (acute pyelonephritis).  Infections that last for a long period of time (chronic pyelonephritis). In most cases, the infection clears up with treatment and does not cause further problems. More severe infections or chronic infections can sometimes spread to the bloodstream or lead to other problems with the kidneys. CAUSES This condition is usually caused by:  Bacteria traveling from the bladder to the kidney through infected urine. The urine in the bladder can become infected with bacteria from:  Bladder infection (cystitis).  Inflammation of the prostate gland (prostatitis).  Sexual intercourse, in females.  Bacteria traveling from the bloodstream to the kidney. RISK FACTORS This condition is more likely to develop in:  Pregnant women.  Older people.  People who have diabetes.  People who have kidney stones or bladder stones.  People who have other abnormalities of the kidney or ureter.  People who have a catheter placed in the bladder.  People who have cancer.  People who are sexually active.  Women who use spermicides.  People who have had a prior urinary tract infection. SYMPTOMS Symptoms of this condition include:  Frequent urination.  Strong or persistent urge to urinate.  Burning or stinging when urinating.  Abdominal pain.  Back pain.  Pain in the side  or flank area.  Fever.  Chills.  Blood in the urine, or dark urine.  Nausea.  Vomiting. DIAGNOSIS This condition may be diagnosed based on:  Medical history and physical exam.  Urine tests.  Blood tests. You may also have imaging tests of the kidneys, such as an ultrasound or CT scan. TREATMENT Treatment for this condition may depend on the severity of the infection.  If the infection is mild and is found early, you may be treated with antibiotic medicines taken by mouth. You will need to drink fluids to remain hydrated.  If the infection is more severe, you may need to stay in the hospital and receive antibiotics given directly into a vein through an IV tube. You may also need to receive fluids through an IV tube if you are not able to remain hydrated. After your hospital stay, you may need to take oral antibiotics for a period of time. Other treatments may be required, depending on the cause of the infection. HOME CARE INSTRUCTIONS Medicines  Take over-the-counter and prescription medicines only as told by your health care provider.  If you were prescribed an antibiotic medicine, take it as told by your health care provider. Do not stop taking the antibiotic even if you start to feel better. General Instructions  Drink enough fluid to keep your urine clear or pale  yellow.  Avoid caffeine, tea, and carbonated beverages. They tend to irritate the bladder.  Urinate often. Avoid holding in urine for long periods of time.  Urinate before and after sex.  After a bowel movement, women should cleanse from front to back. Use each tissue only once.  Keep all follow-up visits as told by your health care provider. This is important. SEEK MEDICAL CARE IF:  Your symptoms do not get better after 2 days of treatment.  Your symptoms get worse.  You have a fever. SEEK IMMEDIATE MEDICAL CARE IF:  You are unable to take your antibiotics or fluids.  You have shaking  chills.  You vomit.  You have severe flank or back pain.  You have extreme weakness or fainting.   This information is not intended to replace advice given to you by your health care provider. Make sure you discuss any questions you have with your health care provider.   Document Released: 06/26/2005 Document Revised: 03/17/2015 Document Reviewed: 10/19/2014 Elsevier Interactive Patient Education [employer]

## 2015-05-26 NOTE — ED Notes (Signed)
Pt st's she was seen and treated here 2 days ago for UTI.  St's pain in right lower back has gotton worse,  Also c/o hematuria.  Pt is currently taking antibiotic

## 2015-05-26 NOTE — ED Provider Notes (Signed)
CSN: [id number]     Arrival date & time 05/26/15  1543 History  By signing my name below, I, Evon Slackerrance Branch, attest that this documentation has been prepared under the direction and in the presence of Danelle BerryLeisa Ailana Cuadrado, PA-C. Electronically Signed: Evon Slackerrance Branch, ED Scribe. 05/27/2015. 2:04 AM.      Chief Complaint  Patient presents with  . Urinary Tract Infection   The history is provided by the patient. No language interpreter was used.   HPI Comments: Whitney Winters is a 24 y.o. female who presents to the Emergency Department complaining of worsening lower back pain onset 1 week prior. Pt states that she was recently seen in the ED for UTI. She states that she has been complaint with taking antibiotics prescribed but her symptoms have not improved. Pt reports vomiting for one week, dysuria, hematuria and slight bloody vaginal discharge. Pt states that she is not able to tolerate solids or liquids. Pt also reports HA. Pt states that she has not had sexual intercourse within in the past month, prior to that she reports dyspareunia. Pt states that she uses condoms as birth control, she is in a monogamous relationship with her husband. LMP 05/15/2015. Pt also has history of kidney stones but states that this pain does not feel the same. She has a history of kidney infection and this feels similar to that time. She also reports being admitted for similar infections at least 2 times. Pt denies fever or other related symptoms. She does have hot and cold chills, generalized weakness. History is limited by language barrier, patient declined interpreter assistance.   Past Medical History  Diagnosis Date  . Kidney stones   . UTI (urinary tract infection)   . Renal stones   . Kidney stone   . Kidney infection   . Pelvic inflammatory disease    History reviewed. No pertinent past surgical history. Family History  Problem Relation Age of Onset  . Alcohol abuse Neg Hx    Social History   Substance Use Topics  . Smoking status: Never Smoker   . Smokeless tobacco: Never Used  . Alcohol Use: No   OB History    Gravida Para Term Preterm AB TAB SAB Ectopic Multiple Living   2         1      Review of Systems  Constitutional: Positive for chills. Negative for fever.  Gastrointestinal: Positive for nausea, vomiting, abdominal pain and diarrhea. Negative for constipation, blood in stool and rectal pain.  Genitourinary: Positive for dysuria, hematuria, flank pain, vaginal discharge and dyspareunia. Negative for enuresis and difficulty urinating.  Musculoskeletal: Negative.   Skin: Negative.  Negative for color change, pallor, rash and wound.  Neurological: Positive for headaches.     Allergies  Review of patient's allergies indicates no known allergies.  Home Medications   Prior to Admission medications   Medication Sig Start Date End Date Taking? Authorizing Provider  phenazopyridine (PYRIDIUM) 200 MG tablet Take 1 tablet (200 mg total) by mouth 3 (three) times daily. 05/24/15  Yes Trixie DredgeEmily West, PA-C  doxycycline (VIBRAMYCIN) 100 MG capsule Take 1 capsule (100 mg total) by mouth 2 (two) times daily. 05/26/15   Danelle BerryLeisa Jesenya Bowditch, PA-C  metroNIDAZOLE (FLAGYL) 500 MG tablet Take 1 tablet (500 mg total) by mouth 2 (two) times daily. 05/26/15   Danelle BerryLeisa Trevon Strothers, PA-C  nitrofurantoin, macrocrystal-monohydrate, (MACROBID) 100 MG capsule Take 1 capsule (100 mg total) by mouth 2 (two) times daily. Patient not taking: Reported on  05/26/2015 05/24/15   [name], PA-C  ondansetron (ZOFRAN) 4 MG tablet Take 1 tablet (4 mg total) by mouth every 8 (eight) hours as needed for nausea or vomiting. 05/26/15   Danelle Berry, PA-C  traMADol (ULTRAM) 50 MG tablet Take 1 tablet (50 mg total) by mouth every 6 (six) hours as needed. 05/26/15   Danelle Berry, PA-C   BP 101/56 mmHg  Pulse 79  Temp(Src) 97.9 F (36.6 C) (Oral)  Resp 16  Ht  (1.549 m)  Wt 98 lb 9 oz (44.708 kg)  BMI 18.63 kg/m2  SpO2  99%  LMP 05/15/2015   Physical Exam  Constitutional: She is oriented to person, place, and time. She appears well-developed and well-nourished. No distress.  HENT:  Head: Normocephalic and atraumatic.  Nose: Nose normal.  Mouth/Throat: Oropharynx is clear and moist. No oropharyngeal exudate.  Oral mucosa and lips dry  Eyes: Conjunctivae and EOM are normal. Pupils are equal, round, and reactive to light. Right eye exhibits no discharge. Left eye exhibits no discharge. No scleral icterus.  Neck: Normal range of motion. No JVD present. No tracheal deviation present. No thyromegaly present.  Cardiovascular: Normal rate, regular rhythm, normal heart sounds and intact distal pulses.  Exam reveals no gallop and no friction rub.   No murmur heard. Pulmonary/Chest: Effort normal and breath sounds normal. No respiratory distress. She has no wheezes. She has no rales. She exhibits no tenderness.  Abdominal: Soft. Bowel sounds are normal. She exhibits no distension and no mass. There is tenderness. There is guarding and CVA tenderness. There is no rebound.  Bilateral CVA tenderness, R>L,  Suprapubic ttp  Genitourinary: Pelvic exam was performed with patient supine. Uterus is tender. Uterus is not deviated, not enlarged and not fixed. Cervix exhibits motion tenderness, discharge and friability. Right adnexum displays tenderness. Right adnexum displays no mass and no fullness. Left adnexum displays no mass, no tenderness and no fullness. No erythema, tenderness or bleeding in the vagina. No foreign body around the vagina. No signs of injury around the vagina.  Musculoskeletal: Normal range of motion. She exhibits no edema or tenderness.  Lymphadenopathy:    She has no cervical adenopathy.  Neurological: She is alert and oriented to person, place, and time. She has normal reflexes. No cranial nerve deficit. She exhibits normal muscle tone. Coordination normal.  Skin: Skin is warm and dry. No rash noted. She  is not diaphoretic. No erythema. No pallor.  Psychiatric: She has a normal mood and affect. Her behavior is normal. Judgment and thought content normal.  Nursing note and vitals reviewed.   ED Course  Procedures (including critical care time) DIAGNOSTIC STUDIES: Oxygen Saturation is 96% on RA, adequate by my interpretation.    COORDINATION OF CARE: 2:04 AM-Discussed treatment plan with pt at bedside and pt agreed to plan.     Labs Review Labs Reviewed  WET PREP, GENITAL - Abnormal; Notable for the following:    WBC, Wet Prep HPF POC MANY (*)    All other components within normal limits  URINALYSIS, ROUTINE W REFLEX MICROSCOPIC (NOT AT [facility]) - Abnormal; Notable for the following:    Glucose, UA 250 (*)    Hgb urine dipstick TRACE (*)    Bilirubin Urine SMALL (*)    Ketones, ur >80 (*)    Protein, ur 100 (*)    All other components within normal limits  URINE MICROSCOPIC-ADD ON - Abnormal; Notable for the following:    Squamous Epithelial / LPF  0-5 (*)    Bacteria, UA FEW (*)    All other components within normal limits  CBC WITH DIFFERENTIAL/PLATELET  BASIC METABOLIC PANEL  POC URINE PREG, ED  GC/CHLAMYDIA PROBE AMP ([place]) NOT AT [facility]    Imaging Review No results found.    EKG Interpretation None      MDM   Final diagnoses:  Pyelonephritis  PID (pelvic inflammatory disease)   Patient presents with worsening abdominal pain, flank pain, nausea, vomiting and diarrhea, with diagnosis of UTI, 2 days ago. She states she still has hematuria, and has been compliant with her antibiotics without any improvement. She also has a headache, hot and cold chills. Patient had basic labs obtained, urinalysis, POC pregnancy, was given a liter fluid without much improvement with morphine and Zofran.  She was given 1 g of Rocephin to treat possible pyelonephritis.  The patient's urinalysis was pertinent for blood, ketones, protein but negative for nitrites and leukocytes, it  was a contaminated sample.  UTI aspects appear improved from 2 days ago. Her basic lab work was unremarkable she had no electrolyte balance or leukocytosis.  After 1.5 L 2 doses of Zofran and pain meds but patient continued to have emesis after fluid challenge.  Her abdominal pain began to worsen again, and she was treated with Phenergan and 0.5 mg of Dilaudid.  Given her results and discussed the patient's symptoms and decided to proceed with a pelvic exam with her history of admission for PID.    Pelvic exam was positive for discharge, friable cervix, CMT and right adnexal tenderness.  Wet prep results were pertinent for white blood cells but negative for Trichomonas, yeast and clue cells. She was given oral dose of doxycycline and Flagyl, as able to tolerate crackers and the medications.  She was discharged home with treatment for PID, was instructed to finish prescribe medications for her UTI.  Return precautions were reviewed with the patient and her husband, they verbalize understanding.  She was discharged home in satisfactory condition.  Patient states she'll follow-up with her physician on her insurance card.  Medications  sodium chloride 0.9 % bolus 1,000 mL (0 mLs Intravenous Stopped 05/26/15 2127)  ondansetron (ZOFRAN) injection 4 mg (4 mg Intravenous Given 05/26/15 1904)  morphine 4 MG/ML injection 2 mg (2 mg Intravenous Given 05/26/15 1904)  cefTRIAXone (ROCEPHIN) 1 g in dextrose 5 % 50 mL IVPB (0 g Intravenous Stopped 05/26/15 2023)  ondansetron (ZOFRAN) injection 4 mg (4 mg Intravenous Given 05/26/15 2006)  sodium chloride 0.9 % bolus 500 mL (0 mLs Intravenous Stopped 05/26/15 2127)  promethazine (PHENERGAN) injection 25 mg (25 mg Intravenous Given 05/26/15 2138)  HYDROmorphone (DILAUDID) injection 0.5 mg (0.5 mg Intravenous Given 05/26/15 2138)  doxycycline (VIBRA-TABS) tablet 100 mg (100 mg Oral Given 05/26/15 2324)  metroNIDAZOLE (FLAGYL) tablet 500 mg (500 mg Oral Given  05/26/15 2324)   Filed Vitals:   05/26/15 1955 05/26/15 2007 05/26/15 2139 05/27/15 0013  BP: 116/88 109/64 119/66 101/56  Pulse: 76 85 95 79  Temp:    97.9 F (36.6 C)  TempSrc:      Resp: Height:      Weight:      SpO2: 100% 100% 100% 99%   I personally performed the services described in this documentation, which was scribed in my presence. The recorded information has been reviewed and is accurate.        Danelle Berry, PA-C 05/27/15 0210  Melene Plan, DO  05/27/15 0945 

## 2015-05-27 ENCOUNTER — Telehealth (HOSPITAL_BASED_OUTPATIENT_CLINIC_OR_DEPARTMENT_OTHER): Payer: Self-pay | Admitting: Emergency Medicine

## 2015-05-27 LAB — GC/CHLAMYDIA PROBE AMP (~~LOC~~) NOT AT ARMC
Chlamydia: NEGATIVE
Neisseria Gonorrhea: NEGATIVE

## 2015-05-27 NOTE — Telephone Encounter (Signed)
Post ED Visit - Positive Culture Follow-up: Successful Patient Follow-Up  Culture assessed and recommendations reviewed by: [x]  Enzo BiNathan Batchelder, Pharm.D. []  Celedonio MiyamotoJeremy Frens, Pharm.D., BCPS []  Garvin FilaMike Maccia, Pharm.D. []  Georgina PillionElizabeth Martin, Pharm.D., BCPS []  Pine CrestMinh Pham, [address]Pharm[address]D., BCPS, AAHIVP []  Estella HuskMichelle Turner, Pharm.D., BCPS, AAHIVP []  Tennis Mustassie Stewart, Pharm.D. []  Rob Oswaldo DoneVincent, [address]Pharm[address]  Positive urine culture Enterobacter  []  Patient discharged without antimicrobial prescription and treatment is now indicated [x]  Organism is resistant to prescribed ED discharge antimicrobial []  Patient with positive blood cultures  Changes discussed with ED provider: Alveta HeimlichStevi Barrett PA New antibiotic prescription  Stop Macrobid, Bactrim DS tablet po bid x 14 days [facility]  Contacted patient, 05/27/15 1200   Berle MullMiller, Barrett Holthaus 05/27/2015, 11:58 AM

## 2015-05-27 NOTE — Progress Notes (Signed)
ED Antimicrobial Stewardship Positive Culture Follow Up   Whitney Winters is an 24 y.o. female [facility] with a chief complaint of  Chief Complaint  Patient presents with  . Urinary Tract Infection    Recent Results (from the past 720 hour(s))  Urine culture     Status: None   Collection Time: 05/24/15  1:59 PM  Result Value Ref Range Status   Specimen Description URINE, CLEAN CATCH  Final   Special Requests NONE  Final   Culture >=100,000 COLONIES/mL ENTEROBACTER AEROGENES  Final   Report Status 05/26/2015 FINAL  Final   Organism ID, Bacteria ENTEROBACTER AEROGENES  Final      Susceptibility   Enterobacter aerogenes - MIC*    CEFAZOLIN >=64 RESISTANT Resistant     CEFTRIAXONE <=1 SENSITIVE Sensitive     CIPROFLOXACIN <=0.25 SENSITIVE Sensitive     GENTAMICIN <=1 SENSITIVE Sensitive     IMIPENEM 2 SENSITIVE Sensitive     NITROFURANTOIN 64 INTERMEDIATE Intermediate     TRIMETH/SULFA <=20 SENSITIVE Sensitive     PIP/TAZO <=4 SENSITIVE Sensitive     * >=100,000 COLONIES/mL ENTEROBACTER AEROGENES  Wet prep, genital     Status: Abnormal   Collection Time: 05/26/15 11:09 PM  Result Value Ref Range Status   Yeast Wet Prep HPF POC NONE SEEN NONE SEEN Final   Trich, Wet Prep NONE SEEN NONE SEEN Final   Clue Cells Wet Prep HPF POC NONE SEEN NONE SEEN Final   WBC, Wet Prep HPF POC MANY (A) NONE SEEN Final   Sperm NONE SEEN  Final    [x]  Treated with Macrobid, organism resistant to prescribed antimicrobial  New antibiotic prescription:  Stop taking Macrobid Bactrim 1 DS tablet PO x 14 days Continue taking doxycycline and Flagyl for remainder of course  ED Provider: Alveta HeimlichStevi Barrett PA-C  [name],[name] 05/27/2015, 8:50 AM Infectious Diseases Pharmacist Phone# [phone]

## 2015-06-22 ENCOUNTER — Emergency Department (HOSPITAL_COMMUNITY): Payer: Medicaid Other

## 2015-06-22 ENCOUNTER — Emergency Department (HOSPITAL_COMMUNITY)
Admission: EM | Admit: 2015-06-22 | Discharge: 2015-06-22 | Disposition: A | Discharge status: Home / Self Care | Payer: Medicaid Other | Attending: Emergency Medicine | Admitting: Emergency Medicine

## 2015-06-22 ENCOUNTER — Encounter (HOSPITAL_COMMUNITY): Payer: Self-pay | Admitting: Emergency Medicine

## 2015-06-22 DIAGNOSIS — Z79899 Other long term (current) drug therapy: Secondary | ICD-10-CM | POA: Insufficient documentation

## 2015-06-22 DIAGNOSIS — R197 Diarrhea, unspecified: Secondary | ICD-10-CM | POA: Insufficient documentation

## 2015-06-22 DIAGNOSIS — Z87440 Personal history of urinary (tract) infections: Secondary | ICD-10-CM | POA: Insufficient documentation

## 2015-06-22 DIAGNOSIS — Z87442 Personal history of urinary calculi: Secondary | ICD-10-CM | POA: Insufficient documentation

## 2015-06-22 DIAGNOSIS — Z792 Long term (current) use of antibiotics: Secondary | ICD-10-CM | POA: Insufficient documentation

## 2015-06-22 DIAGNOSIS — N23 Unspecified renal colic: Secondary | ICD-10-CM | POA: Insufficient documentation

## 2015-06-22 DIAGNOSIS — Z3202 Encounter for pregnancy test, result negative: Secondary | ICD-10-CM | POA: Insufficient documentation

## 2015-06-22 LAB — COMPREHENSIVE METABOLIC PANEL
ALBUMIN: 4.2 g/dL (ref 3.5–5.0)
ALK PHOS: 64 U/L (ref 38–126)
ALT: 16 U/L (ref 14–54)
ANION GAP: 8 (ref 5–15)
AST: 17 U/L (ref 15–41)
BUN: 12 mg/dL (ref 6–20)
CALCIUM: 9 mg/dL (ref 8.9–10.3)
CHLORIDE: 107 mmol/L (ref 101–111)
CO2: 23 mmol/L (ref 22–32)
Creatinine, Ser: 0.61 mg/dL (ref 0.44–1.00)
GFR calc non Af Amer: >60 mL/min (ref >60)
Glucose, Bld: 98 mg/dL (ref 65–99)
POTASSIUM: 3.5 mmol/L (ref 3.5–5.1)
Sodium: 138 mmol/L (ref 135–145)
Total Bilirubin: 0.6 mg/dL (ref 0.3–1.2)
Total Protein: 6.7 g/dL (ref 6.5–8.1)

## 2015-06-22 LAB — URINE MICROSCOPIC-ADD ON

## 2015-06-22 LAB — URINALYSIS, ROUTINE W REFLEX MICROSCOPIC
Bilirubin Urine: NEGATIVE
Glucose, UA: NEGATIVE mg/dL
Ketones, ur: 40 mg/dL — AB
NITRITE: NEGATIVE
Protein, ur: NEGATIVE mg/dL
SPECIFIC GRAVITY, URINE: 1.029 (ref 1.005–1.030)
pH: 6 (ref 5.0–8.0)

## 2015-06-22 LAB — CBC
HEMATOCRIT: 37.7 % (ref 36.0–46.0)
HEMOGLOBIN: 12.2 g/dL (ref 12.0–15.0)
MCH: 27.9 pg (ref 26.0–34.0)
MCHC: 32.4 g/dL (ref 30.0–36.0)
MCV: 86.1 fL (ref 78.0–100.0)
Platelets: 266 10*3/uL (ref 150–400)
RBC: 4.38 MIL/uL (ref 3.87–5.11)
RDW: 13.3 % (ref 11.5–15.5)
WBC: 9.7 10*3/uL (ref 4.0–10.5)

## 2015-06-22 LAB — LIPASE, BLOOD: Lipase: 26 U/L (ref 11–51)

## 2015-06-22 LAB — HCG, QUANTITATIVE, PREGNANCY

## 2015-06-22 MED ORDER — FENTANYL CITRATE (PF) 100 MCG/2ML IJ SOLN
50.0000 ug | INTRAMUSCULAR | Status: DC | PRN
  Administered 2015-06-22 (×3): 50 ug via INTRAVENOUS
  Filled 2015-06-22 (×2): qty 2

## 2015-06-22 MED ORDER — OXYCODONE-ACETAMINOPHEN 5-325 MG PO TABS: 1.0000 | ORAL_TABLET | Freq: Four times a day (QID) | ORAL | Status: AC | PRN

## 2015-06-22 MED ORDER — SODIUM CHLORIDE 0.9 % IV BOLUS (SEPSIS)
1000.0000 mL | Freq: Once | INTRAVENOUS | Status: AC
  Administered 2015-06-22: 1000 mL via INTRAVENOUS

## 2015-06-22 MED ORDER — KETOROLAC TROMETHAMINE 15 MG/ML IJ SOLN
15.0000 mg | Freq: Once | INTRAMUSCULAR | Status: AC
  Administered 2015-06-22: 15 mg via INTRAVENOUS
  Filled 2015-06-22: qty 1

## 2015-06-22 MED ORDER — TAMSULOSIN HCL 0.4 MG PO CAPS: 0.4000 mg | ORAL_CAPSULE | Freq: Every day | ORAL | Status: AC

## 2015-06-22 NOTE — Discharge Instructions (Signed)
You have a 4.5 mm stone in your right ureter that is causing you pain.  Get rechecked immediately if you develop fevers, uncontrolled pain or new concerning symptoms.     Kidney Stones Kidney stones (urolithiasis) are deposits that form inside your kidneys. The intense pain is caused by the stone moving through the urinary tract. When the stone moves, the ureter goes into spasm around the stone. The stone is usually passed in the urine.  CAUSES   A disorder that makes certain neck glands produce too much parathyroid hormone (primary hyperparathyroidism).  A buildup of uric acid crystals, similar to gout in your joints.  Narrowing (stricture) of the ureter.  A kidney obstruction present at birth (congenital obstruction).  Previous surgery on the kidney or ureters.  Numerous kidney infections. SYMPTOMS   Feeling sick to your stomach (nauseous).  Throwing up (vomiting).  Blood in the urine (hematuria).  Pain that usually spreads (radiates) to the groin.  Frequency or urgency of urination. DIAGNOSIS   Taking a history and physical exam.  Blood or urine tests.  CT scan.  Occasionally, an examination of the inside of the urinary bladder (cystoscopy) is performed. TREATMENT   Observation.  Increasing your fluid intake.  Extracorporeal shock wave lithotripsy--This is a noninvasive procedure that uses shock waves to break up kidney stones.  Surgery may be needed if you have severe pain or persistent obstruction. There are various surgical procedures. Most of the procedures are performed with the use of small instruments. Only small incisions are needed to accommodate these instruments, so recovery time is minimized. The size, location, and chemical composition are all important variables that will determine the proper choice of action for you. Talk to your health care provider to better understand your situation so that you will minimize the risk of injury to yourself and your  kidney.  HOME CARE INSTRUCTIONS   Drink enough water and fluids to keep your urine clear or pale yellow. This will help you to pass the stone or stone fragments.  Strain all urine through the provided strainer. Keep all particulate matter and stones for your health care provider to see. The stone causing the pain may be as small as a grain of salt. It is very important to use the strainer each and every time you pass your urine. The collection of your stone will allow your health care provider to analyze it and verify that a stone has actually passed. The stone analysis will often identify what you can do to reduce the incidence of recurrences.  Only take over-the-counter or prescription medicines for pain, discomfort, or fever as directed by your health care provider.  Keep all follow-up visits as told by your health care provider. This is important.  Get follow-up X-rays if required. The absence of pain does not always mean that the stone has passed. It may have only stopped moving. If the urine remains completely obstructed, it can cause loss of kidney function or even complete destruction of the kidney. It is your responsibility to make sure X-rays and follow-ups are completed. Ultrasounds of the kidney can show blockages and the status of the kidney. Ultrasounds are not associated with any radiation and can be performed easily in a matter of minutes.  Make changes to your daily diet as told by your health care provider. You may be told to:  Limit the amount of salt that you eat.  Eat 5 or more servings of fruits and vegetables each day.  Limit  the amount of meat, poultry, fish, and eggs that you eat.  Collect a 24-hour urine sample as told by your health care provider.You may need to collect another urine sample every 6-12 months. SEEK MEDICAL CARE IF:  You experience pain that is progressive and unresponsive to any pain medicine you have been prescribed. SEEK IMMEDIATE MEDICAL CARE  IF:   Pain cannot be controlled with the prescribed medicine.  You have a fever or shaking chills.  The severity or intensity of pain increases over 18 hours and is not relieved by pain medicine.  You develop a new onset of abdominal pain.  You feel faint or pass out.  You are unable to urinate.   This information is not intended to replace advice given to you by your health care provider. Make sure you discuss any questions you have with your health care provider.   Document Released: 06/26/2005 Document Revised: 03/17/2015 Document Reviewed: 11/27/2012 Elsevier Interactive Patient Education [employer].

## 2015-06-22 NOTE — ED Provider Notes (Signed)
CSN: [id number]     Arrival date & time 06/22/15  4098 History   First MD Initiated Contact with Patient 06/22/15 [phone]     Chief Complaint  Patient presents with  . Abdominal Pain     Patient is a 24 y.o. female presenting with abdominal pain. The history is provided by the patient. No language interpreter was used.  Abdominal Pain  Whitney Winters is a 24 y.o. female who presents to the Emergency Department complaining of abdominal pain.  She had mild right sided abdominal pain yesterday.  Today she developed severe right sided abdominal pain that radiates to her back.  She has associated vomiting and decreased urinary output.  She has mild diarrhea.  No fevers, no vaginal discharge.  She has a hx/o renal colic during pregnancy. She did not have any interventions at that time and delivered 5 months ago.  She is not breastfeeding.  Sxs are severe, constant, worsening.    Past Medical History  Diagnosis Date  . Kidney stones   . UTI (urinary tract infection)   . Renal stones   . Kidney stone   . Kidney infection   . Pelvic inflammatory disease    History reviewed. No pertinent past surgical history. Family History  Problem Relation Age of Onset  . Alcohol abuse Neg Hx    Social History  Substance Use Topics  . Smoking status: Never Smoker   . Smokeless tobacco: Never Used  . Alcohol Use: No   OB History    Gravida Para Term Preterm AB TAB SAB Ectopic Multiple Living   2         1     Review of Systems  Gastrointestinal: Positive for abdominal pain.  All other systems reviewed and are negative.     Allergies  Review of patient's allergies indicates no known allergies.  Home Medications   Prior to Admission medications   Medication Sig Start Date End Date Taking? Authorizing Provider  doxycycline (VIBRAMYCIN) 100 MG capsule Take 1 capsule (100 mg total) by mouth 2 (two) times daily. 05/26/15   Danelle Berry, PA-C  metroNIDAZOLE (FLAGYL) 500 MG tablet Take 1  tablet (500 mg total) by mouth 2 (two) times daily. 05/26/15   Danelle Berry, PA-C  nitrofurantoin, macrocrystal-monohydrate, (MACROBID) 100 MG capsule Take 1 capsule (100 mg total) by mouth 2 (two) times daily. Patient not taking: Reported on 05/26/2015 05/24/15   [name], PA-C  ondansetron (ZOFRAN) 4 MG tablet Take 1 tablet (4 mg total) by mouth every 8 (eight) hours as needed for nausea or vomiting. 05/26/15   Danelle Berry, PA-C  phenazopyridine (PYRIDIUM) 200 MG tablet Take 1 tablet (200 mg total) by mouth 3 (three) times daily. 05/24/15   [name], PA-C  traMADol (ULTRAM) 50 MG tablet Take 1 tablet (50 mg total) by mouth every 6 (six) hours as needed. 05/26/15   Danelle Berry, PA-C   BP 124/83 mmHg  Pulse 99  Temp(Src) 98.4 F (36.9 C) (Oral)  Resp 20  SpO2 100%  LMP 05/15/2015 (Exact Date) Physical Exam  Constitutional: She is oriented to person, place, and time. She appears well-developed and well-nourished. She appears distressed.  Uncomfortable appearing  HENT:  Head: Normocephalic and atraumatic.  Cardiovascular: Normal rate and regular rhythm.   No murmur heard. Pulmonary/Chest: Effort normal and breath sounds normal. No respiratory distress.  Abdominal: Soft. There is no rebound and no guarding.  Mild RLQ tenderness  Musculoskeletal: She exhibits no edema or tenderness.  Neurological: She is alert and oriented to person, place, and time.  Skin: Skin is warm and dry.  Psychiatric: She has a normal mood and affect. Her behavior is normal.  Nursing note and vitals reviewed.   ED Course  Procedures (including critical care time) Labs Review Labs Reviewed  URINALYSIS, ROUTINE W REFLEX MICROSCOPIC (NOT AT [facility]) - Abnormal; Notable for the following:    Color, Urine AMBER (*)    APPearance CLOUDY (*)    Hgb urine dipstick LARGE (*)    Ketones, ur 40 (*)    Leukocytes, UA TRACE (*)    All other components within normal limits  URINE MICROSCOPIC-ADD ON - Abnormal; Notable  for the following:    Squamous Epithelial / LPF 0-5 (*)    Bacteria, UA FEW (*)    All other components within normal limits  URINE CULTURE  LIPASE, BLOOD  COMPREHENSIVE METABOLIC PANEL  CBC  HCG, QUANTITATIVE, PREGNANCY    Imaging Review Ct Renal Stone Study  06/22/2015  CLINICAL DATA:  Right flank pain for 2 weeks and hematuria. EXAM: CT ABDOMEN AND PELVIS WITHOUT CONTRAST TECHNIQUE: Multidetector CT imaging of the abdomen and pelvis was performed following the standard protocol without IV contrast. COMPARISON:  None. FINDINGS: Lower chest: The lung bases are clear of acute process. No pleural effusion or pulmonary lesions. The heart is normal in size. No pericardial effusion. The distal esophagus and aorta are unremarkable. Hepatobiliary: No focal hepatic lesions or intrahepatic biliary dilatation. The gallbladder is normal. No common bile duct dilatation. Pancreas: No mass, inflammation or ductal dilatation. Spleen: Normal size.  No focal lesions. Adrenals/Urinary Tract: The adrenal glands are normal. The right kidney demonstrates nephrocalcinosis and a small lower pole calculus. There is Moderate right-sided hydronephrosis. The kidney is also slightly enlarged and edematous. There is perinephric fluid noted. Findings consistent with high-grade obstruction. The right ureter is also dilated down to a 4.5 mm right UVJ calculus. The left kidney demonstrates multiple small calculi and medullary nephrocalcinosis. Stomach/Bowel: The stomach, duodenum, small bowel and colon are grossly normal without oral contrast. No inflammatory changes, mass lesions or obstructive findings. The terminal ileum and appendix are normal. Vascular/Lymphatic: No mesenteric or retroperitoneal mass or adenopathy. The aorta is normal in caliber. Reproductive: The uterus and ovaries are unremarkable. The uterus is retroverted. Other: No pelvic mass or adenopathy. No free pelvic fluid collections. The bladder is unremarkable. No  inguinal mass or adenopathy. Musculoskeletal: No significant bony findings. IMPRESSION: 1. 4.5 mm right UVJ calculus causing high-grade obstruction of the right kidney and ureter. 2. 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 3. No other significant abdominal/pelvic findings. Electronically Signed   By: Rudie MeyerP.  Gallerani M.D.   On: 06/22/2015 11:17   I have personally reviewed and evaluated these images and lab results as part of my medical decision-making.   EKG Interpretation None      MDM   Final diagnoses:  Renal colic on right side    Pt here for evaluation of right sided abdominal pain.  CT demonstrates right sided UVJ stone.  UA and hx not c/w UTI.  Pt's pain is significantly improved on repeat evaluation in the ED.  Discussed home care for renal colic as well as Urology follow up and return precautions.      Tilden FossaElizabeth Pierre Dellarocco, MD 06/22/15 [phone]

## 2015-06-22 NOTE — ED Notes (Signed)
Pt c/o lower right abd pain, pt crying and rocking in wheel chair-- states it woke her up at 3am. Has had a kidney stone "some months ago" . Has been vomiting and having diarrhea this am also.

## 2015-06-22 NOTE — ED Notes (Signed)
Patient transported to CT 

## 2015-06-22 NOTE — ED Notes (Signed)
Pt unable to void at this time. 

## 2015-06-23 LAB — URINE CULTURE: Culture: NO GROWTH

## 2015-10-13 ENCOUNTER — Encounter (HOSPITAL_COMMUNITY): Payer: Self-pay | Admitting: Emergency Medicine

## 2015-10-13 ENCOUNTER — Emergency Department (HOSPITAL_COMMUNITY)
Admission: EM | Admit: 2015-10-13 | Discharge: 2015-10-14 | Disposition: A | Discharge status: Home / Self Care | Payer: Medicaid Other | Attending: Emergency Medicine | Admitting: Emergency Medicine

## 2015-10-13 DIAGNOSIS — Z3202 Encounter for pregnancy test, result negative: Secondary | ICD-10-CM | POA: Insufficient documentation

## 2015-10-13 DIAGNOSIS — Z79899 Other long term (current) drug therapy: Secondary | ICD-10-CM | POA: Insufficient documentation

## 2015-10-13 DIAGNOSIS — Z792 Long term (current) use of antibiotics: Secondary | ICD-10-CM | POA: Insufficient documentation

## 2015-10-13 DIAGNOSIS — Z8742 Personal history of other diseases of the female genital tract: Secondary | ICD-10-CM | POA: Insufficient documentation

## 2015-10-13 DIAGNOSIS — Z87442 Personal history of urinary calculi: Secondary | ICD-10-CM | POA: Insufficient documentation

## 2015-10-13 DIAGNOSIS — N390 Urinary tract infection, site not specified: Secondary | ICD-10-CM | POA: Insufficient documentation

## 2015-10-13 LAB — URINALYSIS, ROUTINE W REFLEX MICROSCOPIC
Bilirubin Urine: NEGATIVE
GLUCOSE, UA: NEGATIVE mg/dL
KETONES UR: NEGATIVE mg/dL
Nitrite: NEGATIVE
PH: 8.5 — AB (ref 5.0–8.0)
Protein, ur: 30 mg/dL — AB
Specific Gravity, Urine: 1.026 (ref 1.005–1.030)

## 2015-10-13 LAB — COMPREHENSIVE METABOLIC PANEL
ALT: 12 U/L — AB (ref 14–54)
AST: 16 U/L (ref 15–41)
Albumin: 4.3 g/dL (ref 3.5–5.0)
Alkaline Phosphatase: 61 U/L (ref 38–126)
Anion gap: 10 (ref 5–15)
BILIRUBIN TOTAL: 0.3 mg/dL (ref 0.3–1.2)
BUN: 8 mg/dL (ref 6–20)
CO2: 22 mmol/L (ref 22–32)
CREATININE: 0.57 mg/dL (ref 0.44–1.00)
Calcium: 9.1 mg/dL (ref 8.9–10.3)
Chloride: 109 mmol/L (ref 101–111)
GFR calc Af Amer: >60 mL/min (ref >60)
Glucose, Bld: 90 mg/dL (ref 65–99)
Potassium: 4.1 mmol/L (ref 3.5–5.1)
Sodium: 141 mmol/L (ref 135–145)
TOTAL PROTEIN: 7 g/dL (ref 6.5–8.1)

## 2015-10-13 LAB — URINE MICROSCOPIC-ADD ON

## 2015-10-13 LAB — CBC
HCT: 38.3 % (ref 36.0–46.0)
Hemoglobin: 12.1 g/dL (ref 12.0–15.0)
MCH: 27.4 pg (ref 26.0–34.0)
MCHC: 31.6 g/dL (ref 30.0–36.0)
MCV: 86.8 fL (ref 78.0–100.0)
PLATELETS: 287 10*3/uL (ref 150–400)
RBC: 4.41 MIL/uL (ref 3.87–5.11)
RDW: 13.7 % (ref 11.5–15.5)
WBC: 8.7 10*3/uL (ref 4.0–10.5)

## 2015-10-13 LAB — POC URINE PREG, ED: Preg Test, Ur: NEGATIVE

## 2015-10-13 MED ORDER — OXYCODONE-ACETAMINOPHEN 5-325 MG PO TABS
ORAL_TABLET | ORAL | Status: AC
  Filled 2015-10-13: qty 1

## 2015-10-13 MED ORDER — OXYCODONE-ACETAMINOPHEN 5-325 MG PO TABS
1.0000 | ORAL_TABLET | ORAL | Status: DC | PRN
  Administered 2015-10-13: 1 via ORAL

## 2015-10-13 NOTE — ED Provider Notes (Signed)
CSN: [id number]     Arrival date & time 10/13/15  2022 History   First MD Initiated Contact with Patient 10/13/15 2352     Chief Complaint  Patient presents with  . Back Pain  . Dysuria     (Consider location/radiation/quality/duration/timing/severity/associated sxs/prior Treatment) HPI Comments: Patient is a 25 year old female with history of renal calculi and urinary tract infection. She presents for evaluation of pain in her right flank and dysuria that started 2 days ago. She denies any fevers or chills. She denies any vaginal discharge or bleeding. Her last period was last month and was normal. She was diagnosed with kidney stones in December 2016, however this does not feel the same.  Patient is a 25 y.o. female presenting with dysuria. The history is provided by the patient.  Dysuria Pain quality:  Burning Pain severity:  Moderate Onset quality:  Gradual Duration:  2 days Timing:  Constant Progression:  Worsening Chronicity:  New Relieved by:  Nothing Worsened by:  Nothing tried Ineffective treatments:  None tried Associated symptoms: flank pain   Associated symptoms: no fever     Past Medical History  Diagnosis Date  . Kidney stones   . UTI (urinary tract infection)   . Renal stones   . Kidney stone   . Kidney infection   . Pelvic inflammatory disease    History reviewed. No pertinent past surgical history. Family History  Problem Relation Age of Onset  . Alcohol abuse Neg Hx    Social History  Substance Use Topics  . Smoking status: Never Smoker   . Smokeless tobacco: Never Used  . Alcohol Use: No   OB History    Gravida Para Term Preterm AB TAB SAB Ectopic Multiple Living   2         1     Review of Systems  Constitutional: Negative for fever.  Genitourinary: Positive for dysuria and flank pain.  All other systems reviewed and are negative.     Allergies  Review of patient's allergies indicates no known allergies.  Home Medications   Prior  to Admission medications   Medication Sig Start Date End Date Taking? Authorizing Provider  doxycycline (VIBRAMYCIN) 100 MG capsule Take 1 capsule (100 mg total) by mouth 2 (two) times daily. 05/26/15   Danelle BerryLeisa Tapia, PA-C  metroNIDAZOLE (FLAGYL) 500 MG tablet Take 1 tablet (500 mg total) by mouth 2 (two) times daily. 05/26/15   Danelle BerryLeisa Tapia, PA-C  ondansetron (ZOFRAN) 4 MG tablet Take 1 tablet (4 mg total) by mouth every 8 (eight) hours as needed for nausea or vomiting. 05/26/15   Danelle BerryLeisa Tapia, PA-C  oxyCODONE-acetaminophen (PERCOCET/ROXICET) 5-325 MG tablet Take 1 tablet by mouth every 6 (six) hours as needed for severe pain. 06/22/15   Tilden FossaElizabeth Rees, MD  tamsulosin (FLOMAX) 0.4 MG CAPS capsule Take 1 capsule (0.4 mg total) by mouth daily. 06/22/15   Tilden FossaElizabeth Rees, MD  traMADol (ULTRAM) 50 MG tablet Take 1 tablet (50 mg total) by mouth every 6 (six) hours as needed. 05/26/15   Danelle BerryLeisa Tapia, PA-C   BP 125/68 mmHg  Pulse 77  Temp(Src) 97.9 F (36.6 C) (Oral)  Resp 20  Ht 5\' 2"  (1.575 m)  Wt 100 lb 8 oz (45.587 kg)  BMI 18.38 kg/m2  SpO2 99%  LMP 09/30/2015 Physical Exam  Constitutional: She is oriented to person, place, and time. She appears well-developed and well-nourished. No distress.  HENT:  Head: Normocephalic and atraumatic.  Neck: Normal range of motion. Neck  supple.  Cardiovascular: Normal rate and regular rhythm.  Exam reveals no gallop and no friction rub.   No murmur heard. Pulmonary/Chest: Effort normal and breath sounds normal. No respiratory distress. She has no wheezes.  Abdominal: Soft. Bowel sounds are normal. She exhibits no distension. There is tenderness. There is no rebound and no guarding.  There is mild tenderness to palpation in the right flank and right midabdomen.  Musculoskeletal: Normal range of motion.  Neurological: She is alert and oriented to person, place, and time.  Skin: Skin is warm and dry. She is not diaphoretic.  Nursing note and vitals  reviewed.   ED Course  Procedures (including critical care time) Labs Review Labs Reviewed  COMPREHENSIVE METABOLIC PANEL - Abnormal; Notable for the following:    ALT 12 (*)    All other components within normal limits  URINALYSIS, ROUTINE W REFLEX MICROSCOPIC (NOT AT [facility]) - Abnormal; Notable for the following:    Color, Urine AMBER (*)    APPearance CLOUDY (*)    pH 8.5 (*)    Hgb urine dipstick SMALL (*)    Protein, ur 30 (*)    Leukocytes, UA MODERATE (*)    All other components within normal limits  URINE MICROSCOPIC-ADD ON - Abnormal; Notable for the following:    Squamous Epithelial / LPF 6-30 (*)    Bacteria, UA MANY (*)    All other components within normal limits  CBC  POC URINE PREG, ED    Imaging Review No results found. I have personally reviewed and evaluated these images and lab results as part of my medical decision-making.   EKG Interpretation None      MDM   Final diagnoses:  None    Urinalysis is most consistent with a urinary tract infection. Her presentation and exam are not consistent with a renal calculus. She will be treated with antibiotics and when necessary return.    Geoffery Lyons, MD 10/14/15 [phone]

## 2015-10-13 NOTE — ED Notes (Signed)
Pt. reports right lower back pain with dysuria and concentrated urine onset 3 days ago , denies fever or chills.

## 2015-10-14 MED ORDER — CIPROFLOXACIN HCL 500 MG PO TABS: 500.0000 mg | ORAL_TABLET | Freq: Two times a day (BID) | ORAL | Status: AC

## 2015-10-14 MED ORDER — HYDROCODONE-ACETAMINOPHEN 5-325 MG PO TABS: 1.0000 | ORAL_TABLET | Freq: Four times a day (QID) | ORAL | Status: AC | PRN

## 2015-10-14 NOTE — Discharge Instructions (Signed)
Cipro as prescribed.  Hydrocodone as prescribed as needed for pain.  Return to the ER if your symptoms significantly worsen or change.   Urinary Tract Infection Urinary tract infections (UTIs) can develop anywhere along your urinary tract. Your urinary tract is your body's drainage system for removing wastes and extra water. Your urinary tract includes two kidneys, two ureters, a bladder, and a urethra. Your kidneys are a pair of bean-shaped organs. Each kidney is about the size of your fist. They are located below your ribs, one on each side of your spine. CAUSES Infections are caused by microbes, which are microscopic organisms, including fungi, viruses, and bacteria. These organisms are so small that they can only be seen through a microscope. Bacteria are the microbes that most commonly cause UTIs. SYMPTOMS  Symptoms of UTIs may vary by age and gender of the patient and by the location of the infection. Symptoms in young women typically include a frequent and intense urge to urinate and a painful, burning feeling in the bladder or urethra during urination. Older women and men are more likely to be tired, shaky, and weak and have muscle aches and abdominal pain. A fever may mean the infection is in your kidneys. Other symptoms of a kidney infection include pain in your back or sides below the ribs, nausea, and vomiting. DIAGNOSIS To diagnose a UTI, your caregiver will ask you about your symptoms. Your caregiver will also ask you to provide a urine sample. The urine sample will be tested for bacteria and white blood cells. White blood cells are made by your body to help fight infection. TREATMENT  Typically, UTIs can be treated with medication. Because most UTIs are caused by a bacterial infection, they usually can be treated with the use of antibiotics. The choice of antibiotic and length of treatment depend on your symptoms and the type of bacteria causing your infection. HOME CARE  INSTRUCTIONS  If you were prescribed antibiotics, take them exactly as your caregiver instructs you. Finish the medication even if you feel better after you have only taken some of the medication.  Drink enough water and fluids to keep your urine clear or pale yellow.  Avoid caffeine, tea, and carbonated beverages. They tend to irritate your bladder.  Empty your bladder often. Avoid holding urine for long periods of time.  Empty your bladder before and after sexual intercourse.  After a bowel movement, women should cleanse from front to back. Use each tissue only once. SEEK MEDICAL CARE IF:   You have back pain.  You develop a fever.  Your symptoms do not begin to resolve within 3 days. SEEK IMMEDIATE MEDICAL CARE IF:   You have severe back pain or lower abdominal pain.  You develop chills.  You have nausea or vomiting.  You have continued burning or discomfort with urination. MAKE SURE YOU:   Understand these instructions.  Will watch your condition.  Will get help right away if you are not doing well or get worse.   This information is not intended to replace advice given to you by your health care provider. Make sure you discuss any questions you have with your health care provider.   Document Released: 04/05/2005 Document Revised: 03/17/2015 Document Reviewed: 08/04/2011 Elsevier Interactive Patient Education [employer]2016 [employer]

## 2016-03-05 IMAGING — CT CT RENAL STONE PROTOCOL
2 of 4 series · 12 of 46 positions shown, 14 images · non-contrast
Comparison: None.

CLINICAL DATA: Right flank pain for 2 weeks and hematuria.

EXAM:
CT ABDOMEN AND PELVIS WITHOUT CONTRAST
TECHNIQUE: Multidetector CT imaging of the abdomen and pelvis was performed
following the standard protocol without IV contrast.

[Series 201: stone study, idose (2) · axial · 0.71mm/px · z∈[-999,-679]mm · 9 of 78 slices shown, 11 images]
[im 7/78  soft-tissue]
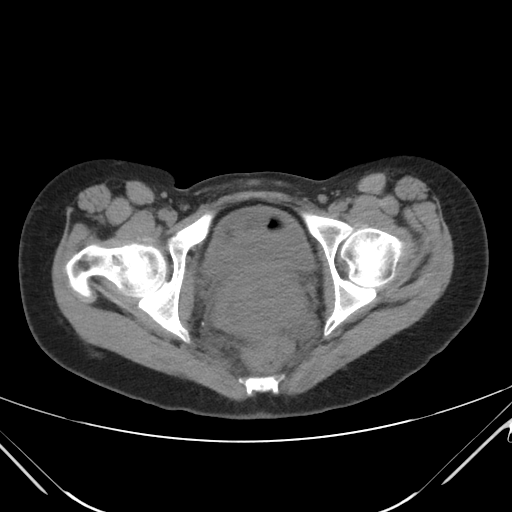
[im 7/78  bone]
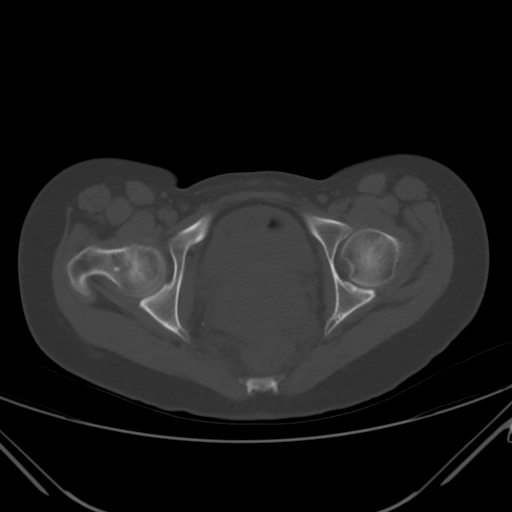
[im 14/78  soft-tissue]
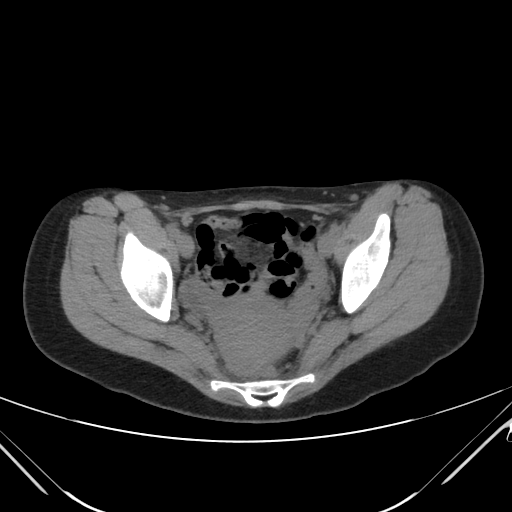
[im 24/78  soft-tissue]
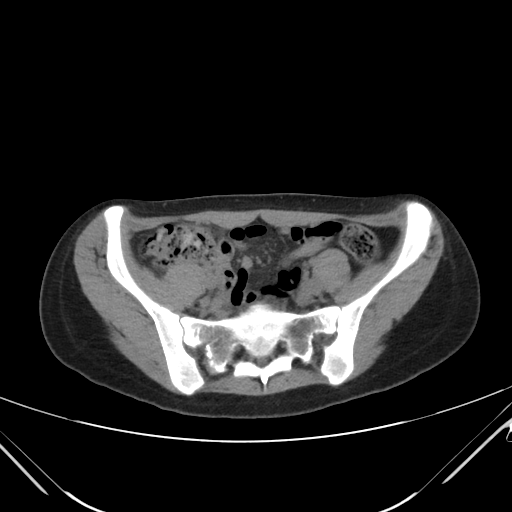
[im 31/78  soft-tissue]
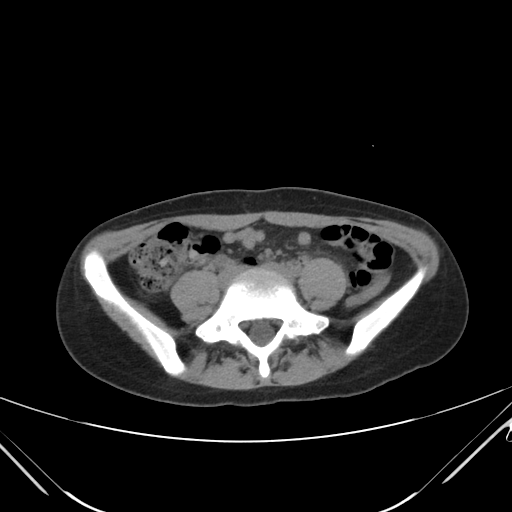
[im 41/78  soft-tissue]
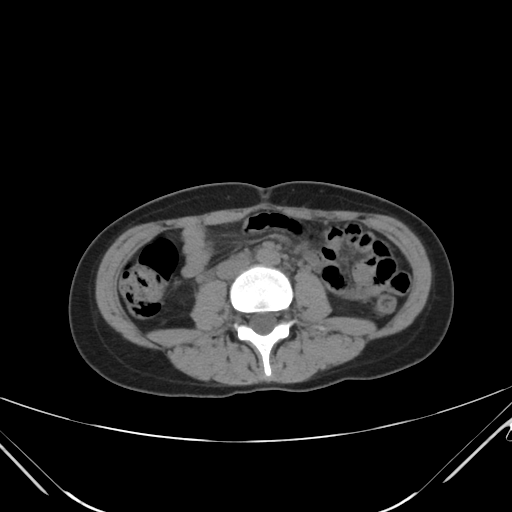
[im 47/78  soft-tissue]
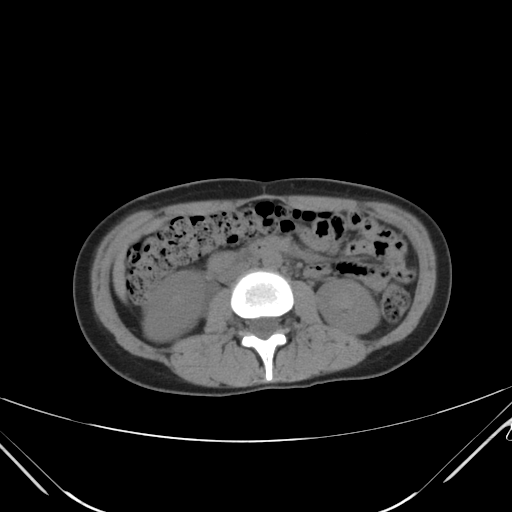
[im 54/78  soft-tissue]
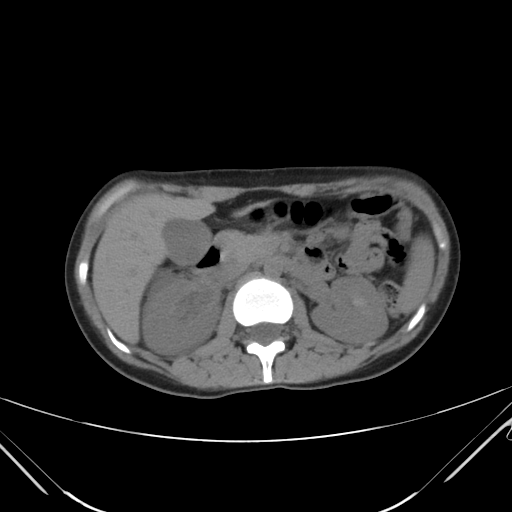
[im 64/78  soft-tissue]
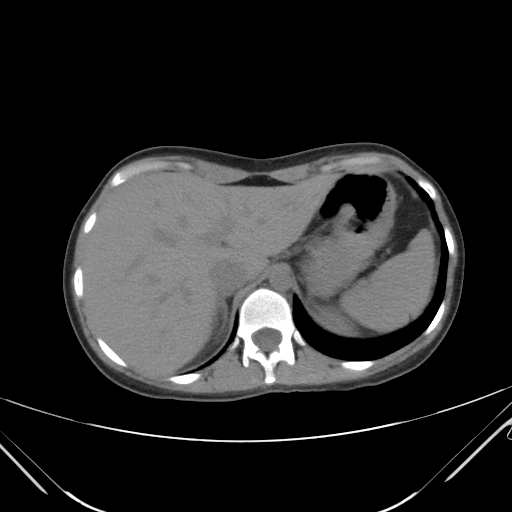
[im 71/78  soft-tissue]
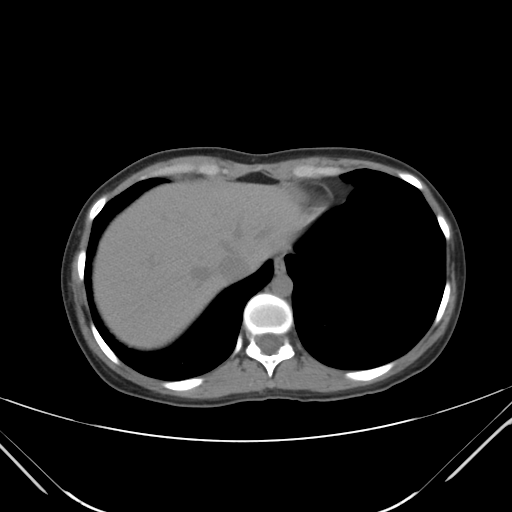
[im 71/78  bone]
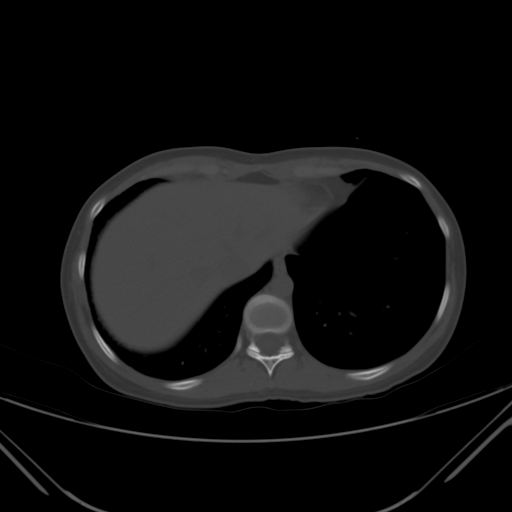

[Series 203: coronals, idose (2) · coronal · 0.45mm/px · 3 of 96 slices shown]
[im 32/96  soft-tissue]
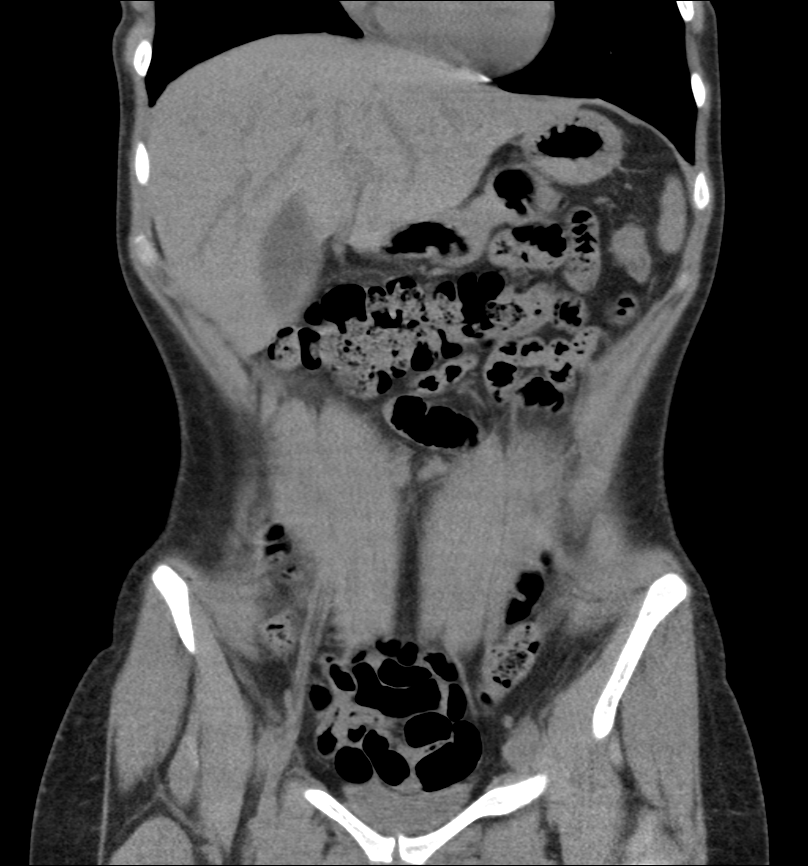
[im 43/96  soft-tissue]
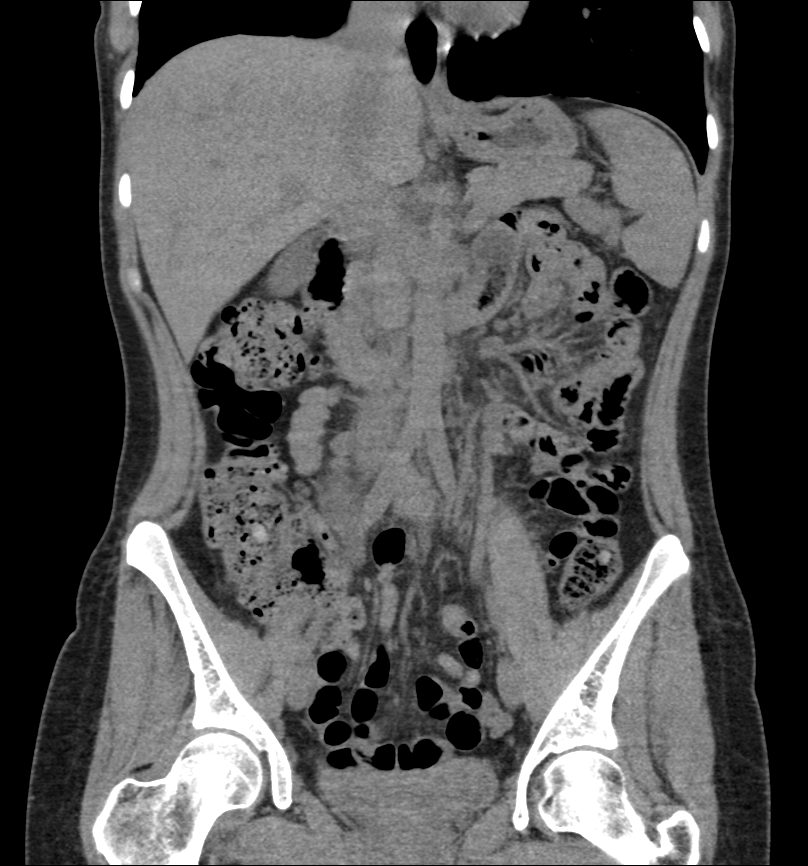
[im 53/96  soft-tissue]
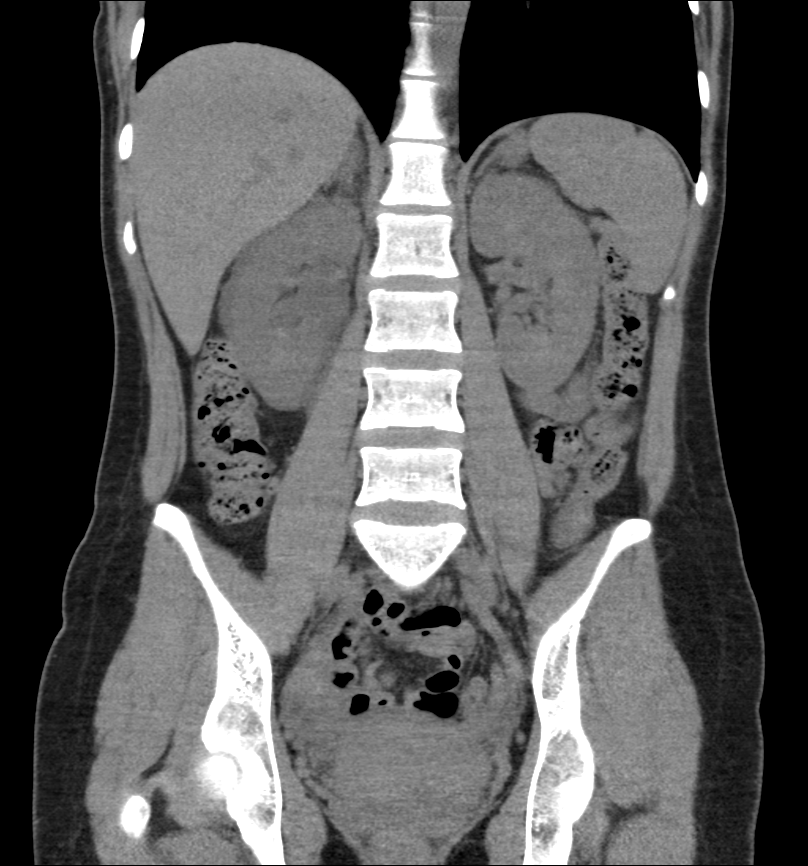

[12 of 46 positions shown; findings below may reference images not displayed]

FINDINGS: Lower chest: The lung bases are clear of acute process. No pleural
effusion or pulmonary lesions. The heart is normal in size. No
pericardial effusion. The distal esophagus and aorta are
unremarkable.

Hepatobiliary: No focal hepatic lesions or intrahepatic biliary
dilatation. The gallbladder is normal. No common bile duct
dilatation.

Pancreas: No mass, inflammation or ductal dilatation.

Spleen: Normal size.  No focal lesions.

Adrenals/Urinary Tract: The adrenal glands are normal.

The right kidney demonstrates nephrocalcinosis and a small lower
pole calculus. There is Moderate right-sided hydronephrosis. The
kidney is also slightly enlarged and edematous. There is perinephric
fluid noted. Findings consistent with high-grade obstruction. The
right ureter is also dilated down to a 4.5 mm right UVJ calculus.

The left kidney demonstrates multiple small calculi and medullary
nephrocalcinosis.

Stomach/Bowel: The stomach, duodenum, small bowel and colon are
grossly normal without oral contrast. No inflammatory changes, mass
lesions or obstructive findings. The terminal ileum and appendix are
normal.

Vascular/Lymphatic: No mesenteric or retroperitoneal mass or
adenopathy. The aorta is normal in caliber.

Reproductive: The uterus and ovaries are unremarkable. The uterus is
retroverted.

Other: No pelvic mass or adenopathy. No free pelvic fluid
collections. The bladder is unremarkable. No inguinal mass or
adenopathy.

Musculoskeletal: No significant bony findings.
IMPRESSION: 1. 4.5 mm right UVJ calculus causing high-grade obstruction of the
right kidney and ureter.
2. Bilateral medullary nephrocalcinosis and small bilateral renal
calculi. Findings likely due to medullary sponge kidney.
3. No other significant abdominal/pelvic findings.
